# Patient Record
Sex: Female | Born: 1955
Health system: Southern US, Community
[De-identification: ages and names within clinical notes are randomized; demographics above are authoritative.]

## PROBLEM LIST (undated history)

## (undated) DIAGNOSIS — D509 Iron deficiency anemia, unspecified: Secondary | ICD-10-CM

## (undated) DIAGNOSIS — E049 Nontoxic goiter, unspecified: Secondary | ICD-10-CM

## (undated) DIAGNOSIS — M2550 Pain in unspecified joint: Secondary | ICD-10-CM

## (undated) DIAGNOSIS — I10 Essential (primary) hypertension: Secondary | ICD-10-CM

## (undated) DIAGNOSIS — Z780 Asymptomatic menopausal state: Secondary | ICD-10-CM

## (undated) DIAGNOSIS — E0590 Thyrotoxicosis, unspecified without thyrotoxic crisis or storm: Secondary | ICD-10-CM

## (undated) DIAGNOSIS — N938 Other specified abnormal uterine and vaginal bleeding: Secondary | ICD-10-CM

## (undated) DIAGNOSIS — D219 Benign neoplasm of connective and other soft tissue, unspecified: Secondary | ICD-10-CM

## (undated) HISTORY — PX: GUM SURGERY: SHX658

## (undated) HISTORY — DX: Asymptomatic menopausal state: Z78.0

## (undated) HISTORY — DX: Thyrotoxicosis, unspecified without thyrotoxic crisis or storm: E05.90

## (undated) HISTORY — DX: Nontoxic goiter, unspecified: E04.9

## (undated) HISTORY — PX: ECTOPIC PREGNANCY SURGERY: SHX613

## (undated) HISTORY — PX: COLONOSCOPY: SHX174

## (undated) HISTORY — PX: OTHER SURGICAL HISTORY: SHX169

## (undated) HISTORY — DX: Benign neoplasm of connective and other soft tissue, unspecified: D21.9

## (undated) HISTORY — DX: Pain in unspecified joint: M25.50

## (undated) HISTORY — PX: TUBAL LIGATION: SHX77

## (undated) HISTORY — DX: Other specified abnormal uterine and vaginal bleeding: N93.8

## (undated) HISTORY — DX: Iron deficiency anemia, unspecified: D50.9

---

## 1998-02-22 ENCOUNTER — Other Ambulatory Visit: Admission: RE | Admit: 1998-02-22 | Discharge: 1998-02-22 | Payer: Self-pay | Admitting: Obstetrics and Gynecology

## 1998-04-29 ENCOUNTER — Ambulatory Visit (HOSPITAL_COMMUNITY): Admission: RE | Admit: 1998-04-29 | Discharge: 1998-04-29 | Payer: Self-pay | Admitting: Obstetrics and Gynecology

## 1998-04-29 ENCOUNTER — Encounter: Payer: Self-pay | Admitting: Obstetrics and Gynecology

## 1999-05-07 ENCOUNTER — Inpatient Hospital Stay (HOSPITAL_COMMUNITY): Admission: AD | Admit: 1999-05-07 | Discharge: 1999-05-07 | Payer: Self-pay | Admitting: Obstetrics and Gynecology

## 2000-10-14 ENCOUNTER — Ambulatory Visit (HOSPITAL_COMMUNITY): Admission: RE | Admit: 2000-10-14 | Discharge: 2000-10-14 | Payer: Self-pay | Admitting: Internal Medicine

## 2000-10-14 ENCOUNTER — Encounter: Payer: Self-pay | Admitting: Internal Medicine

## 2001-05-11 ENCOUNTER — Encounter: Payer: Self-pay | Admitting: Obstetrics and Gynecology

## 2001-05-11 ENCOUNTER — Ambulatory Visit (HOSPITAL_COMMUNITY): Admission: RE | Admit: 2001-05-11 | Discharge: 2001-05-11 | Payer: Self-pay | Admitting: Obstetrics and Gynecology

## 2007-06-16 DIAGNOSIS — E049 Nontoxic goiter, unspecified: Secondary | ICD-10-CM

## 2007-06-16 HISTORY — DX: Nontoxic goiter, unspecified: E04.9

## 2007-11-15 ENCOUNTER — Ambulatory Visit (HOSPITAL_COMMUNITY): Admission: RE | Admit: 2007-11-15 | Discharge: 2007-11-15 | Payer: Self-pay | Admitting: Obstetrics and Gynecology

## 2008-02-08 ENCOUNTER — Encounter (HOSPITAL_COMMUNITY): Admission: RE | Admit: 2008-02-08 | Discharge: 2008-03-01 | Payer: Self-pay | Admitting: Internal Medicine

## 2008-02-24 ENCOUNTER — Encounter (HOSPITAL_COMMUNITY): Admission: RE | Admit: 2008-02-24 | Discharge: 2008-04-18 | Payer: Self-pay | Admitting: Internal Medicine

## 2008-06-28 ENCOUNTER — Encounter: Admission: RE | Admit: 2008-06-28 | Discharge: 2008-06-28 | Payer: Self-pay | Admitting: Internal Medicine

## 2008-07-11 ENCOUNTER — Other Ambulatory Visit: Admission: RE | Admit: 2008-07-11 | Discharge: 2008-07-11 | Payer: Self-pay | Admitting: Interventional Radiology

## 2008-07-11 ENCOUNTER — Encounter: Admission: RE | Admit: 2008-07-11 | Discharge: 2008-07-11 | Payer: Self-pay | Admitting: Internal Medicine

## 2008-07-11 ENCOUNTER — Encounter (INDEPENDENT_AMBULATORY_CARE_PROVIDER_SITE_OTHER): Payer: Self-pay | Admitting: Interventional Radiology

## 2010-07-07 ENCOUNTER — Encounter: Payer: Self-pay | Admitting: Internal Medicine

## 2010-08-18 ENCOUNTER — Other Ambulatory Visit (HOSPITAL_COMMUNITY): Payer: Self-pay | Admitting: Obstetrics and Gynecology

## 2010-08-18 DIAGNOSIS — Z1231 Encounter for screening mammogram for malignant neoplasm of breast: Secondary | ICD-10-CM

## 2010-08-25 ENCOUNTER — Ambulatory Visit (HOSPITAL_COMMUNITY)
Admission: RE | Admit: 2010-08-25 | Discharge: 2010-08-25 | Disposition: A | Discharge status: Home / Self Care | Payer: BC Managed Care – PPO | Source: Ambulatory Visit | Attending: Obstetrics and Gynecology | Admitting: Obstetrics and Gynecology

## 2010-08-25 DIAGNOSIS — Z1231 Encounter for screening mammogram for malignant neoplasm of breast: Secondary | ICD-10-CM

## 2010-09-03 DIAGNOSIS — Z833 Family history of diabetes mellitus: Secondary | ICD-10-CM | POA: Insufficient documentation

## 2010-09-03 DIAGNOSIS — Z8639 Personal history of other endocrine, nutritional and metabolic disease: Secondary | ICD-10-CM | POA: Insufficient documentation

## 2011-08-05 DIAGNOSIS — Z8639 Personal history of other endocrine, nutritional and metabolic disease: Secondary | ICD-10-CM

## 2011-08-05 DIAGNOSIS — Z833 Family history of diabetes mellitus: Secondary | ICD-10-CM

## 2011-09-14 ENCOUNTER — Other Ambulatory Visit: Payer: Self-pay | Admitting: Obstetrics and Gynecology

## 2011-09-14 DIAGNOSIS — Z1231 Encounter for screening mammogram for malignant neoplasm of breast: Secondary | ICD-10-CM

## 2011-09-16 ENCOUNTER — Ambulatory Visit (INDEPENDENT_AMBULATORY_CARE_PROVIDER_SITE_OTHER): Payer: BC Managed Care – PPO | Admitting: Obstetrics and Gynecology

## 2011-09-16 DIAGNOSIS — Z01419 Encounter for gynecological examination (general) (routine) without abnormal findings: Secondary | ICD-10-CM

## 2011-09-23 ENCOUNTER — Other Ambulatory Visit: Payer: BC Managed Care – PPO

## 2011-10-07 ENCOUNTER — Other Ambulatory Visit: Payer: BC Managed Care – PPO

## 2011-10-07 DIAGNOSIS — E785 Hyperlipidemia, unspecified: Secondary | ICD-10-CM

## 2011-10-07 DIAGNOSIS — E0590 Thyrotoxicosis, unspecified without thyrotoxic crisis or storm: Secondary | ICD-10-CM

## 2011-10-07 LAB — LIPID PANEL
Cholesterol: 197 mg/dL (ref 0–200)
LDL Cholesterol: 128 mg/dL — ABNORMAL HIGH (ref 0–99)
Triglycerides: 65 mg/dL (ref <150)

## 2011-10-08 ENCOUNTER — Telehealth: Payer: Self-pay

## 2011-10-08 ENCOUNTER — Ambulatory Visit (HOSPITAL_COMMUNITY): Payer: BC Managed Care – PPO | Attending: Obstetrics and Gynecology

## 2011-10-08 NOTE — Telephone Encounter (Signed)
Lm on vm to cb per test results.  

## 2011-10-09 ENCOUNTER — Telehealth: Payer: Self-pay

## 2011-10-09 NOTE — Telephone Encounter (Signed)
Lm on vm to cb per test results.  

## 2011-10-09 NOTE — Telephone Encounter (Signed)
No response from pt rgdg request for test results. Chart to file back.

## 2011-10-12 NOTE — Telephone Encounter (Signed)
Lm on vm to cb per test results.  

## 2011-10-13 NOTE — Telephone Encounter (Signed)
No response from pt after several attempts rgdg test results. Chart forwarded to have letter sent to pt to call office.

## 2011-10-14 ENCOUNTER — Encounter: Payer: Self-pay | Admitting: Obstetrics and Gynecology

## 2011-10-20 ENCOUNTER — Telehealth: Payer: Self-pay | Admitting: Obstetrics and Gynecology

## 2011-10-21 NOTE — Telephone Encounter (Signed)
Lm on vm for pt.to cb °

## 2011-10-21 NOTE — Telephone Encounter (Signed)
Tc from pt . Told pt TSH-wnl. LDL-elevated(128). Pt to fu with pcp rgdg labs. Pt states,"will establish pcp for fu". Results mailed to pt per pt's req. Pt voices understanding.

## 2011-10-21 NOTE — Telephone Encounter (Signed)
To chandra 

## 2012-06-02 ENCOUNTER — Ambulatory Visit (HOSPITAL_COMMUNITY)
Admission: RE | Admit: 2012-06-02 | Discharge: 2012-06-02 | Disposition: A | Discharge status: Home / Self Care | Payer: BC Managed Care – PPO | Source: Ambulatory Visit | Attending: Obstetrics and Gynecology | Admitting: Obstetrics and Gynecology

## 2012-06-02 DIAGNOSIS — Z1231 Encounter for screening mammogram for malignant neoplasm of breast: Secondary | ICD-10-CM | POA: Insufficient documentation

## 2013-03-24 ENCOUNTER — Other Ambulatory Visit: Payer: Self-pay | Admitting: Obstetrics and Gynecology

## 2013-03-24 DIAGNOSIS — Z1231 Encounter for screening mammogram for malignant neoplasm of breast: Secondary | ICD-10-CM

## 2013-06-05 ENCOUNTER — Ambulatory Visit (HOSPITAL_COMMUNITY): Payer: BC Managed Care – PPO

## 2013-06-22 ENCOUNTER — Ambulatory Visit (HOSPITAL_COMMUNITY): Payer: BC Managed Care – PPO

## 2013-06-28 ENCOUNTER — Ambulatory Visit (HOSPITAL_COMMUNITY)
Admission: RE | Admit: 2013-06-28 | Discharge: 2013-06-28 | Disposition: A | Discharge status: Home / Self Care | Payer: BC Managed Care – PPO | Source: Ambulatory Visit | Attending: Obstetrics and Gynecology | Admitting: Obstetrics and Gynecology

## 2013-06-28 DIAGNOSIS — Z1231 Encounter for screening mammogram for malignant neoplasm of breast: Secondary | ICD-10-CM | POA: Insufficient documentation

## 2015-08-16 ENCOUNTER — Other Ambulatory Visit: Payer: Self-pay | Admitting: Family Medicine

## 2015-08-16 DIAGNOSIS — M7989 Other specified soft tissue disorders: Secondary | ICD-10-CM

## 2015-08-22 ENCOUNTER — Ambulatory Visit
Admission: RE | Admit: 2015-08-22 | Discharge: 2015-08-22 | Disposition: A | Discharge status: Home / Self Care | Payer: Self-pay | Source: Ambulatory Visit | Attending: Family Medicine | Admitting: Family Medicine

## 2015-08-22 DIAGNOSIS — M7989 Other specified soft tissue disorders: Secondary | ICD-10-CM

## 2015-09-26 DIAGNOSIS — J069 Acute upper respiratory infection, unspecified: Secondary | ICD-10-CM | POA: Diagnosis not present

## 2015-09-26 DIAGNOSIS — R05 Cough: Secondary | ICD-10-CM | POA: Diagnosis not present

## 2015-09-26 DIAGNOSIS — M47819 Spondylosis without myelopathy or radiculopathy, site unspecified: Secondary | ICD-10-CM | POA: Diagnosis not present

## 2015-09-26 DIAGNOSIS — R0602 Shortness of breath: Secondary | ICD-10-CM | POA: Diagnosis not present

## 2016-04-13 DIAGNOSIS — M5406 Panniculitis affecting regions of neck and back, lumbar region: Secondary | ICD-10-CM | POA: Diagnosis not present

## 2016-04-13 DIAGNOSIS — M4317 Spondylolisthesis, lumbosacral region: Secondary | ICD-10-CM | POA: Diagnosis not present

## 2016-04-13 DIAGNOSIS — M5134 Other intervertebral disc degeneration, thoracic region: Secondary | ICD-10-CM | POA: Diagnosis not present

## 2016-04-13 DIAGNOSIS — M5137 Other intervertebral disc degeneration, lumbosacral region: Secondary | ICD-10-CM | POA: Diagnosis not present

## 2016-04-14 DIAGNOSIS — M5134 Other intervertebral disc degeneration, thoracic region: Secondary | ICD-10-CM | POA: Diagnosis not present

## 2016-04-14 DIAGNOSIS — M4317 Spondylolisthesis, lumbosacral region: Secondary | ICD-10-CM | POA: Diagnosis not present

## 2016-04-14 DIAGNOSIS — M5406 Panniculitis affecting regions of neck and back, lumbar region: Secondary | ICD-10-CM | POA: Diagnosis not present

## 2016-04-14 DIAGNOSIS — M5137 Other intervertebral disc degeneration, lumbosacral region: Secondary | ICD-10-CM | POA: Diagnosis not present

## 2016-04-15 DIAGNOSIS — M5134 Other intervertebral disc degeneration, thoracic region: Secondary | ICD-10-CM | POA: Diagnosis not present

## 2016-04-15 DIAGNOSIS — M4317 Spondylolisthesis, lumbosacral region: Secondary | ICD-10-CM | POA: Diagnosis not present

## 2016-04-15 DIAGNOSIS — M5137 Other intervertebral disc degeneration, lumbosacral region: Secondary | ICD-10-CM | POA: Diagnosis not present

## 2016-04-15 DIAGNOSIS — M5406 Panniculitis affecting regions of neck and back, lumbar region: Secondary | ICD-10-CM | POA: Diagnosis not present

## 2016-04-20 DIAGNOSIS — M5137 Other intervertebral disc degeneration, lumbosacral region: Secondary | ICD-10-CM | POA: Diagnosis not present

## 2016-04-20 DIAGNOSIS — M5134 Other intervertebral disc degeneration, thoracic region: Secondary | ICD-10-CM | POA: Diagnosis not present

## 2016-04-20 DIAGNOSIS — M5406 Panniculitis affecting regions of neck and back, lumbar region: Secondary | ICD-10-CM | POA: Diagnosis not present

## 2016-04-20 DIAGNOSIS — M4317 Spondylolisthesis, lumbosacral region: Secondary | ICD-10-CM | POA: Diagnosis not present

## 2016-04-21 DIAGNOSIS — M5137 Other intervertebral disc degeneration, lumbosacral region: Secondary | ICD-10-CM | POA: Diagnosis not present

## 2016-04-21 DIAGNOSIS — M4317 Spondylolisthesis, lumbosacral region: Secondary | ICD-10-CM | POA: Diagnosis not present

## 2016-04-21 DIAGNOSIS — M5406 Panniculitis affecting regions of neck and back, lumbar region: Secondary | ICD-10-CM | POA: Diagnosis not present

## 2016-04-21 DIAGNOSIS — M5134 Other intervertebral disc degeneration, thoracic region: Secondary | ICD-10-CM | POA: Diagnosis not present

## 2016-04-28 DIAGNOSIS — M5137 Other intervertebral disc degeneration, lumbosacral region: Secondary | ICD-10-CM | POA: Diagnosis not present

## 2016-04-28 DIAGNOSIS — M4317 Spondylolisthesis, lumbosacral region: Secondary | ICD-10-CM | POA: Diagnosis not present

## 2016-04-28 DIAGNOSIS — M5134 Other intervertebral disc degeneration, thoracic region: Secondary | ICD-10-CM | POA: Diagnosis not present

## 2016-04-28 DIAGNOSIS — M5406 Panniculitis affecting regions of neck and back, lumbar region: Secondary | ICD-10-CM | POA: Diagnosis not present

## 2016-05-04 DIAGNOSIS — M5406 Panniculitis affecting regions of neck and back, lumbar region: Secondary | ICD-10-CM | POA: Diagnosis not present

## 2016-05-04 DIAGNOSIS — M4317 Spondylolisthesis, lumbosacral region: Secondary | ICD-10-CM | POA: Diagnosis not present

## 2016-05-04 DIAGNOSIS — M5134 Other intervertebral disc degeneration, thoracic region: Secondary | ICD-10-CM | POA: Diagnosis not present

## 2016-05-04 DIAGNOSIS — M5137 Other intervertebral disc degeneration, lumbosacral region: Secondary | ICD-10-CM | POA: Diagnosis not present

## 2016-05-05 DIAGNOSIS — M4317 Spondylolisthesis, lumbosacral region: Secondary | ICD-10-CM | POA: Diagnosis not present

## 2016-05-05 DIAGNOSIS — M5134 Other intervertebral disc degeneration, thoracic region: Secondary | ICD-10-CM | POA: Diagnosis not present

## 2016-05-05 DIAGNOSIS — M5406 Panniculitis affecting regions of neck and back, lumbar region: Secondary | ICD-10-CM | POA: Diagnosis not present

## 2016-05-05 DIAGNOSIS — M5137 Other intervertebral disc degeneration, lumbosacral region: Secondary | ICD-10-CM | POA: Diagnosis not present

## 2016-05-11 DIAGNOSIS — M5406 Panniculitis affecting regions of neck and back, lumbar region: Secondary | ICD-10-CM | POA: Diagnosis not present

## 2016-05-11 DIAGNOSIS — M5134 Other intervertebral disc degeneration, thoracic region: Secondary | ICD-10-CM | POA: Diagnosis not present

## 2016-05-11 DIAGNOSIS — M5137 Other intervertebral disc degeneration, lumbosacral region: Secondary | ICD-10-CM | POA: Diagnosis not present

## 2016-05-11 DIAGNOSIS — M4317 Spondylolisthesis, lumbosacral region: Secondary | ICD-10-CM | POA: Diagnosis not present

## 2016-06-17 DIAGNOSIS — J209 Acute bronchitis, unspecified: Secondary | ICD-10-CM | POA: Diagnosis not present

## 2016-07-09 DIAGNOSIS — Z23 Encounter for immunization: Secondary | ICD-10-CM | POA: Diagnosis not present

## 2016-08-23 ENCOUNTER — Encounter (HOSPITAL_COMMUNITY): Payer: Self-pay | Admitting: Emergency Medicine

## 2016-08-23 ENCOUNTER — Emergency Department (HOSPITAL_COMMUNITY)
Admission: EM | Admit: 2016-08-23 | Discharge: 2016-08-23 | Disposition: A | Discharge status: Home / Self Care | Payer: BLUE CROSS/BLUE SHIELD | Attending: Emergency Medicine | Admitting: Emergency Medicine

## 2016-08-23 DIAGNOSIS — R040 Epistaxis: Secondary | ICD-10-CM | POA: Diagnosis not present

## 2016-08-23 DIAGNOSIS — E119 Type 2 diabetes mellitus without complications: Secondary | ICD-10-CM | POA: Diagnosis not present

## 2016-08-23 DIAGNOSIS — I10 Essential (primary) hypertension: Secondary | ICD-10-CM | POA: Diagnosis not present

## 2016-08-23 DIAGNOSIS — R030 Elevated blood-pressure reading, without diagnosis of hypertension: Secondary | ICD-10-CM | POA: Diagnosis not present

## 2016-08-23 MED ORDER — SILVER NITRATE-POT NITRATE 75-25 % EX MISC
1.0000 "application " | Freq: Once | CUTANEOUS | Status: AC
  Administered 2016-08-23: 1 via TOPICAL
  Filled 2016-08-23: qty 1

## 2016-08-23 MED ORDER — OXYMETAZOLINE HCL 0.05 % NA SOLN
1.0000 | Freq: Once | NASAL | Status: AC
  Administered 2016-08-23: 1 via NASAL
  Filled 2016-08-23 (×2): qty 15

## 2016-08-23 NOTE — ED Triage Notes (Signed)
Pt had nose bleed onset 1 and was able to stop the bleeding but started again around 3 and has not been able to stop. Pt went to UC and was sent for further evaluation.

## 2016-08-23 NOTE — Discharge Instructions (Addendum)
Please read and follow all provided instructions.  Your diagnoses today include:  1. Epistaxis   2. Hypertension, unspecified type     Tests performed today include: Vital signs. See below for your results today.   Medications prescribed:  Take as prescribed   Home care instructions:  Follow any educational materials contained in this packet.  Follow-up instructions: Please follow-up with your primary care provider for further evaluation of symptoms and treatment   Return instructions:  Please return to the Emergency Department if you do not get better, if you get worse, or new symptoms OR  - Fever (temperature greater than 101.71F)  - Bleeding that does not stop with holding pressure to the area    -Severe pain (please note that you may be more sore the day after your accident)  - Chest Pain  - Difficulty breathing  - Severe nausea or vomiting  - Inability to tolerate food and liquids  - Passing out  - Skin becoming red around your wounds  - Change in mental status (confusion or lethargy)  - New numbness or weakness    Please return if you have any other emergent concerns.  Additional Information:  Your vital signs today were: BP 171/99 (BP Location: Left Arm)    Pulse 114    Resp 18    SpO2 97%  If your blood pressure (BP) was elevated above 135/85 this visit, please have this repeated by your doctor within one month. ---------------

## 2016-08-23 NOTE — ED Provider Notes (Signed)
[facility] DEPT Provider Note   CSN: [id number] Arrival date & time: 08/23/16  1642     History   Chief Complaint No chief complaint on file.   HPI Stephanie Roth is a 61 y.o. female.  HPI  61 y.o. female with a hx of DM, presents to the Emergency Department today complaining of epistaxis with onset yesterday. Notes intermittent up until today. Bleeding from left nare. No N/V. No headache. No syncope. No lightheadedness. No CP/SOB. No trauma. Sent from UC due to epistaxis and HTN. No other symptoms noted.   Past Medical History:  Diagnosis Date  . Diabetes mellitus   . DUB (dysfunctional uterine bleeding)   . Fibroids    uterine  . Goiter 2009  . Hyperthyroidism   . Iron deficiency anemia   . Joint pain   . Menopause     Patient Active Problem List   Diagnosis Date Noted  . History of hyperthyroidism 09/03/2010  . Family history of diabetes mellitus 09/03/2010    Past Surgical History:  Procedure Laterality Date  . CESAREAN SECTION    . ECTOPIC PREGNANCY SURGERY    . GUM SURGERY    . TUBAL LIGATION    . TUBAL LIGATION      OB History    No data available       Home Medications    Prior to Admission medications   Not on File    Family History Family History  Problem Relation Age of Onset  . Hypertension Mother     Social History Social History  Substance Use Topics  . Smoking status: Not on file  . Smokeless tobacco: Not on file  . Alcohol use Not on file     Allergies   Patient has no known allergies.   Review of Systems Review of Systems ROS reviewed and all are negative for acute change except as noted in the HPI.  Physical Exam Updated Vital Signs BP 171/99 (BP Location: Left Arm)   Pulse 114   Resp 18   SpO2 97%   Physical Exam  Constitutional: She is oriented to person, place, and time. Vital signs are normal. She appears well-developed and well-nourished.  HENT:  Head: Normocephalic and atraumatic.  Right Ear:  Hearing normal.  Left Ear: Hearing normal.  Nose: Epistaxis (left nare) is observed.  Anterior epistaxis noted  Eyes: Conjunctivae and EOM are normal. Pupils are equal, round, and reactive to light.  Neck: Normal range of motion. Neck supple.  Cardiovascular: Normal rate, regular rhythm, normal heart sounds and intact distal pulses.   Pulmonary/Chest: Effort normal and breath sounds normal. No respiratory distress. She has no wheezes.  Abdominal: Soft. There is no tenderness.  Musculoskeletal: Normal range of motion.  Neurological: She is alert and oriented to person, place, and time.  Skin: Skin is warm and dry.  Psychiatric: She has a normal mood and affect. Her speech is normal and behavior is normal. Thought content normal.  Nursing note and vitals reviewed.  ED Treatments / Results  Labs (all labs ordered are listed, but only abnormal results are displayed) Labs Reviewed - No data to display  EKG  EKG Interpretation None      Radiology No results found.  Procedures .Epistaxis Management Date/Time: 08/23/2016 5:44 PM Performed by: Shary Decamp Authorized by: Shary Decamp   Consent:    Consent obtained:  Verbal   Consent given by:  Patient Procedure details:    Treatment site:  L anterior  Treatment method:  Silver nitrate   Treatment complexity:  Limited   Treatment episode: initial   Post-procedure details:    Assessment:  Bleeding stopped   Patient tolerance of procedure:  Tolerated well, no immediate complications   (including critical care time)  Medications Ordered in ED Medications  silver nitrate applicators applicator 1 application (not administered)  oxymetazoline (AFRIN) 0.05 % nasal spray 1 spray (1 spray Each Nare Given 08/23/16 1724)   Initial Impression / Assessment and Plan / ED Course  I have reviewed the triage vital signs and the nursing notes.  Pertinent labs & imaging results that were available during my care of the patient were reviewed  by me and considered in my medical decision making (see chart for details).  Final Clinical Impressions(s) / ED Diagnoses     {I have reviewed the relevant previous healthcare records.  {I obtained HPI from historian. {Patient discussed with supervising physician.  ED Course:  Assessment: Pt is a 61 y.o. female who presents with epistaxis from left nare. Sent from UC due to uncontrolled bleed and HTN. On exam, pt in NAD. Nontoxic/nonseptic appearing. VSS. Afebrile. Lungs CTA. Heart RRR. Anterior epistaxis noted form left nare. Bleeding controlled with Afrin, direct pressure and silver nitrate. Observed in ED without acute bleed. Plan is to DC home with close follow up to PCP for HTN. At time of discharge, Patient is in no acute distress. Vital Signs are stable. Patient is able to ambulate. Patient able to tolerate PO.   Disposition/Plan:  DC Home Additional Verbal discharge instructions given and discussed with patient.  Pt Instructed to f/u with PCP in the next week for evaluation and treatment of symptoms. Return precautions given Pt acknowledges and agrees with plan  Supervising Physician Sharlett Iles, MD  Final diagnoses:  Epistaxis  Hypertension, unspecified type    New Prescriptions New Prescriptions   No medications on file     Shary Decamp, PA-C 08/23/16 [place], MD 08/25/16 [phone]

## 2016-08-27 DIAGNOSIS — R040 Epistaxis: Secondary | ICD-10-CM | POA: Diagnosis not present

## 2016-08-27 DIAGNOSIS — I10 Essential (primary) hypertension: Secondary | ICD-10-CM | POA: Diagnosis not present

## 2016-10-01 DIAGNOSIS — D649 Anemia, unspecified: Secondary | ICD-10-CM | POA: Diagnosis not present

## 2016-10-01 DIAGNOSIS — I10 Essential (primary) hypertension: Secondary | ICD-10-CM | POA: Diagnosis not present

## 2017-06-04 DIAGNOSIS — I10 Essential (primary) hypertension: Secondary | ICD-10-CM | POA: Diagnosis not present

## 2017-06-04 DIAGNOSIS — Z23 Encounter for immunization: Secondary | ICD-10-CM | POA: Diagnosis not present

## 2017-06-04 DIAGNOSIS — L720 Epidermal cyst: Secondary | ICD-10-CM | POA: Diagnosis not present

## 2017-09-22 DIAGNOSIS — I872 Venous insufficiency (chronic) (peripheral): Secondary | ICD-10-CM | POA: Diagnosis not present

## 2017-09-22 DIAGNOSIS — L720 Epidermal cyst: Secondary | ICD-10-CM | POA: Diagnosis not present

## 2018-01-04 DIAGNOSIS — L509 Urticaria, unspecified: Secondary | ICD-10-CM | POA: Diagnosis not present

## 2018-01-12 DIAGNOSIS — Z6829 Body mass index (BMI) 29.0-29.9, adult: Secondary | ICD-10-CM | POA: Diagnosis not present

## 2018-01-12 DIAGNOSIS — S39012A Strain of muscle, fascia and tendon of lower back, initial encounter: Secondary | ICD-10-CM | POA: Diagnosis not present

## 2018-01-12 DIAGNOSIS — L509 Urticaria, unspecified: Secondary | ICD-10-CM | POA: Diagnosis not present

## 2018-01-18 DIAGNOSIS — T783XXA Angioneurotic edema, initial encounter: Secondary | ICD-10-CM | POA: Diagnosis not present

## 2018-01-18 DIAGNOSIS — Z6829 Body mass index (BMI) 29.0-29.9, adult: Secondary | ICD-10-CM | POA: Diagnosis not present

## 2018-01-27 DIAGNOSIS — R002 Palpitations: Secondary | ICD-10-CM | POA: Diagnosis not present

## 2018-01-27 DIAGNOSIS — Z8639 Personal history of other endocrine, nutritional and metabolic disease: Secondary | ICD-10-CM | POA: Diagnosis not present

## 2018-02-12 DIAGNOSIS — M792 Neuralgia and neuritis, unspecified: Secondary | ICD-10-CM | POA: Diagnosis not present

## 2018-02-12 DIAGNOSIS — Z6830 Body mass index (BMI) 30.0-30.9, adult: Secondary | ICD-10-CM | POA: Diagnosis not present

## 2018-02-12 DIAGNOSIS — M25511 Pain in right shoulder: Secondary | ICD-10-CM | POA: Diagnosis not present

## 2018-02-12 DIAGNOSIS — M79601 Pain in right arm: Secondary | ICD-10-CM | POA: Diagnosis not present

## 2018-02-12 DIAGNOSIS — M9971 Connective tissue and disc stenosis of intervertebral foramina of cervical region: Secondary | ICD-10-CM | POA: Diagnosis not present

## 2018-02-12 DIAGNOSIS — M5030 Other cervical disc degeneration, unspecified cervical region: Secondary | ICD-10-CM | POA: Diagnosis not present

## 2018-02-12 DIAGNOSIS — M50322 Other cervical disc degeneration at C5-C6 level: Secondary | ICD-10-CM | POA: Diagnosis not present

## 2018-02-12 DIAGNOSIS — M19011 Primary osteoarthritis, right shoulder: Secondary | ICD-10-CM | POA: Diagnosis not present

## 2018-03-03 ENCOUNTER — Ambulatory Visit: Payer: BLUE CROSS/BLUE SHIELD | Admitting: Allergy

## 2018-03-03 ENCOUNTER — Encounter: Payer: Self-pay | Admitting: Allergy

## 2018-03-03 VITALS — BP 102/72 | HR 105 | Resp 16 | Ht 58.5 in | Wt 152.4 lb

## 2018-03-03 DIAGNOSIS — H1010 Acute atopic conjunctivitis, unspecified eye: Secondary | ICD-10-CM

## 2018-03-03 DIAGNOSIS — L508 Other urticaria: Secondary | ICD-10-CM | POA: Diagnosis not present

## 2018-03-03 DIAGNOSIS — T783XXD Angioneurotic edema, subsequent encounter: Secondary | ICD-10-CM

## 2018-03-03 NOTE — Patient Instructions (Addendum)
Hives and swelling   - at this time etiology of hives and swelling is unknown.  Hives can be caused by a variety of different triggers including illness/infection, foods, medications, stings, exercise, pressure, vibrations, extremes of temperature to name a few however majority of the time there is no identifiable trigger.  You have history of hyperthyroidism which does place you at increase risk of having other autoimmune disorders like autoimmune hives/swelling.   Your symptoms have been ongoing for >6 weeks making this chronic thus will obtain labwork to evaluate: CBC w diff, CMP, tryptase, hive panel, alpha-gal panel   - environmental allergy skin testing today is positive to grass pollens   - select food allergy skin testing is negative   - for management of hives and swelling use of long-acting antihistamine like Xyzal 5mg  or Allegra 1 tablet twice a day and recommend adding Pepcid 20mg  1 tablet twice a day   Conjunctivitis   - itchy/watery eyes primarily in spring and fall   - allergen avoidance measures to allergens as above   - can continue long-acting antihistamine as needed like your Claritin or Zyrtec   - for itchy/watery eyes can use OTC allergy relief eye drop Alaway or Zaditor 1 drop each eye up to twice a day as needed.   Follow-up 3 months or sooner if needed

## 2018-03-03 NOTE — Progress Notes (Signed)
New Patient Note  RE: Stephanie Roth MRN: [id number] DOB: 1956/04/21 Date of Office Visit: 03/03/2018  Referring provider: Antony Contras, MD Primary care provider: Antony Contras, MD  Chief Complaint: swelling and itching  History of present illness: Stephanie Roth is a 62 y.o. female presenting today for consultation for angioedema and urticaria.    She states she has a history of swelling episodes that she believes is related to MSG.  She states if she eats a large quantity of food with MSG she develops lip swelling.  She has been avoiding MSG.    She states more recently she has been having swelling and itching episodes but has not been eating any MSG.  Her initial reaction was in early August after eating a [employer] fried crispy chicken salad that had cucumbers that were very bitter.  She stopped eating the cucumbers as she was concerned they may have contained MSG with her bitter they were.  The following morning she had develops hives on her back and arms.  She states the itching continued to get worse and reports the rash was spreading.  She went to her PCP clinic and was given prednisone course and benadryl which did help.    The following week she got another [employer] fried crispy chicken salad and did not eat the cucumbers this time.  The hives returned again the following day.  She went to [facility] and was prescribed another prednisone course.  Also received hydroxyzine but states this medication makes her very groggy.   A week later she ate soup her sister made with chicken broth that had seasoned salt.  She woke up in the night and noticed itching and lip felt tingly and was swollen.  She states she called on-call doctor who advised she go to UC.  She received another round of prednisone.  Took several days for swelling to resolve.  Her PCP advised her to take zyrtec instead of claritin.  She started zyrtec 1 tab in PM and 1/2 tab in AM.  The zyrtec also makes her a bit drowsy.   She denies  to her knowledge these meals containing any red meat products.  She states she does eat red meat on occasion.   The hives when they occur come and go and last for several days before they all go away.  They are not leaving any marks or bruising wants to resolve.  She denies any joint aches or pains with the hives and swelling.  She denies any fevers.  She denies any preceding illnesses prior to August.  She has not had any new medications, new foods, change in soaps lotions or detergents.  She does states about 2 weeks prior to onset of these episodes she was stung by a bee on her foot and had local pain and swelling.  She also states she has had several ant bites with local pain and itch.    She has history of hyperthyroidism and states it is under control.  She states she has had an episode of heart racing and she felt lightheaded and went again to UC and states her BP was elevated.  She also states she was itching but does not recall having any rash with these symptoms as well.  She is not sure what triggered these symptoms.  This occurred around the 3rd week of August.   She does report watery and itchy eyes primarily.  Spring and fall are more problematic seasons for her and  usually will take claritin during these seasons which does help.    She denies a history of asthma or eczema.  She denies any previous food allergy history other than the MSG.  Review of systems: Review of Systems  Constitutional: Negative for chills, fever and malaise/fatigue.  HENT: Negative for congestion, ear discharge, nosebleeds and sore throat.   Eyes: Negative for pain, discharge and redness.  Respiratory: Negative for cough, shortness of breath and wheezing.   Cardiovascular: Negative for chest pain.  Gastrointestinal: Negative for abdominal pain, constipation, diarrhea, heartburn, nausea and vomiting.  Musculoskeletal: Negative for joint pain.  Skin: Positive for itching and rash.  Neurological: Negative for  headaches.    All other systems negative unless noted above in HPI  Past medical history: Past Medical History:  Diagnosis Date  . Diabetes mellitus   . DUB (dysfunctional uterine bleeding)   . Fibroids    uterine  . Goiter 2009  . Hyperthyroidism   . Iron deficiency anemia   . Joint pain   . Menopause     Past surgical history: Past Surgical History:  Procedure Laterality Date  . CESAREAN SECTION    . ECTOPIC PREGNANCY SURGERY    . GUM SURGERY    . TUBAL LIGATION      Family history:  Family History  Problem Relation Age of Onset  . Hypertension Mother   . COPD Mother   . Congestive Heart Failure Mother   . Arthritis Mother   . Hypertension Father   . Aortic aneurysm Father   . Psoriasis Sister   . Eczema Sister   . Arthritis Sister     Social history: She lives in a home with carpeting with gas heating and central cooling.  There is a dog in the home.  There is no concern for water damage, mildew or roaches in the home.  She is an Research scientist (life sciences).  She denies a smoking history.  Medication List: Allergies as of 03/03/2018      Reactions   Monosodium Glutamate       Medication List        Accurate as of 03/03/18  1:41 PM. Always use your most recent med list.          amLODipine 2.5 MG tablet Commonly known as:  NORVASC Take 2.5 mg by mouth daily.   cetirizine 10 MG tablet Commonly known as:  ZYRTEC Take 10 mg by mouth daily as needed for allergies.   COD LIVER OIL PO Take 1 capsule by mouth daily.   FLAXSEED OIL PO Take 1,300 mg by mouth 2 (two) times daily.   HONEY PO Take 1 each by mouth as needed.   loratadine 10 MG tablet Commonly known as:  CLARITIN Take 10 mg by mouth daily.   Vitamin D3 2000 units Tabs Take 1 tablet by mouth daily.   WOMENS MULTIVITAMIN PO Take 1 tablet by mouth daily.       Known medication allergies: Allergies  Allergen Reactions  . Monosodium Glutamate      Physical examination: Blood pressure  102/72, pulse (!) 105, resp. rate 16, height 4' 10.5" (1.486 m), weight 152 lb 6.4 oz (69.1 kg), SpO2 96 %.  General: Alert, interactive, in no acute distress. HEENT: PERRLA, TMs pearly gray, turbinates minimally edematous without discharge, post-pharynx non erythematous. Neck: Supple without lymphadenopathy. Lungs: Clear to auscultation without wheezing, rhonchi or rales. {no increased work of breathing. CV: Normal S1, S2 without murmurs. Abdomen: Nondistended, nontender. Skin: Warm  and dry, without lesions or rashes. Extremities:  No clubbing, cyanosis or edema. Neuro:   Grossly intact.  Diagnositics/Labs:  Allergy testing: Environmental allergy skin prick testing is positive to grass pollens.  Intradermal testing is negative.  Select food allergy skin prick testing is negative.  Allergy testing results were read and interpreted by provider, documented by clinical staff.   Assessment and plan: Urticaria with angioedema   - at this time etiology of hives and swelling is unknown.  Hives can be caused by a variety of different triggers including illness/infection, foods, medications, stings, exercise, pressure, vibrations, extremes of temperature to name a few however majority of the time there is no identifiable trigger.  You have history of hyperthyroidism which does place you at increase risk of having other autoimmune disorders like autoimmune hives/swelling.   Your symptoms have been ongoing for >6 weeks making this chronic thus will obtain labwork to evaluate: CBC w diff, CMP, tryptase, hive panel, alpha-gal panel   - environmental allergy skin testing today is positive to grass pollens   - select food allergy skin testing is negative   - for management of hives and swelling use of long-acting antihistamine like Xyzal 5mg  or Allegra 1 tablet twice a day and recommend adding Pepcid 20mg  1 tablet twice a day.  We are changing from Zyrtec as this makes her drowsy and hopefully will be able  to find an antihistamine that does not make her drowsy.   Conjunctivitis, allergic   - She has sensitivity to grass pollens   - allergen avoidance measures to allergens as above   - can continue long-acting antihistamine as needed like your Claritin or Zyrtec   - for itchy/watery eyes can use OTC allergy relief eye drop Alaway or Zaditor 1 drop each eye up to twice a day as needed.   Follow-up 3 months or sooner if needed  I appreciate the opportunity to take part in Fredna's care. Please do not hesitate to contact me with questions.  Sincerely,   Prudy Feeler, MD Allergy/Immunology [facility]

## 2018-03-09 LAB — ALPHA-GAL PANEL
Alpha Gal IgE*: <0.1 kU/L (ref <0.10)
BEEF CLASS INTERPRETATION: 0
Beef (Bos spp) IgE: <0.1 kU/L (ref <0.35)
Class Interpretation: 0
LAMB CLASS INTERPRETATION: 0
Lamb/Mutton (Ovis spp) IgE: <0.1 kU/L (ref <0.35)

## 2018-03-09 LAB — CBC WITH DIFFERENTIAL
Basophils Absolute: 0.1 10*3/uL (ref 0.0–0.2)
Basos: 1 %
EOS (ABSOLUTE): 0.1 10*3/uL (ref 0.0–0.4)
Eos: 1 %
Hematocrit: 41.9 % (ref 34.0–46.6)
Hemoglobin: 13.6 g/dL (ref 11.1–15.9)
IMMATURE GRANULOCYTES: 0 %
Immature Grans (Abs): 0 10*3/uL (ref 0.0–0.1)
Lymphocytes Absolute: 1.7 10*3/uL (ref 0.7–3.1)
Lymphs: 24 %
MCH: 29.7 pg (ref 26.6–33.0)
MCHC: 32.5 g/dL (ref 31.5–35.7)
MCV: 92 fL (ref 79–97)
MONOS ABS: 0.6 10*3/uL (ref 0.1–0.9)
Monocytes: 8 %
NEUTROS PCT: 66 %
Neutrophils Absolute: 4.7 10*3/uL (ref 1.4–7.0)
RBC: 4.58 x10E6/uL (ref 3.77–5.28)
RDW: 13.9 % (ref 12.3–15.4)
WBC: 7.1 10*3/uL (ref 3.4–10.8)

## 2018-03-09 LAB — COMPREHENSIVE METABOLIC PANEL
ALK PHOS: 100 IU/L (ref 39–117)
ALT: 22 IU/L (ref 0–32)
AST: 21 IU/L (ref 0–40)
Albumin/Globulin Ratio: 1.4 (ref 1.2–2.2)
Albumin: 4.4 g/dL (ref 3.6–4.8)
BILIRUBIN TOTAL: 0.2 mg/dL (ref 0.0–1.2)
BUN / CREAT RATIO: 23 (ref 12–28)
BUN: 21 mg/dL (ref 8–27)
CHLORIDE: 104 mmol/L (ref 96–106)
CO2: 23 mmol/L (ref 20–29)
Calcium: 10.2 mg/dL (ref 8.7–10.3)
Creatinine, Ser: 0.9 mg/dL (ref 0.57–1.00)
GFR calc Af Amer: 80 mL/min/{1.73_m2} (ref >59)
GFR calc non Af Amer: 69 mL/min/{1.73_m2} (ref >59)
GLOBULIN, TOTAL: 3.2 g/dL (ref 1.5–4.5)
Glucose: 86 mg/dL (ref 65–99)
POTASSIUM: 4.7 mmol/L (ref 3.5–5.2)
SODIUM: 143 mmol/L (ref 134–144)
Total Protein: 7.6 g/dL (ref 6.0–8.5)

## 2018-03-09 LAB — CHRONIC URTICARIA: cu index: <1.9 (ref <10)

## 2018-03-09 LAB — TRYPTASE: Tryptase: 7.3 ug/L (ref 2.2–13.2)

## 2018-03-14 DIAGNOSIS — E7800 Pure hypercholesterolemia, unspecified: Secondary | ICD-10-CM | POA: Diagnosis not present

## 2018-03-14 DIAGNOSIS — I10 Essential (primary) hypertension: Secondary | ICD-10-CM | POA: Diagnosis not present

## 2018-03-14 DIAGNOSIS — Z1211 Encounter for screening for malignant neoplasm of colon: Secondary | ICD-10-CM | POA: Diagnosis not present

## 2018-03-14 DIAGNOSIS — L508 Other urticaria: Secondary | ICD-10-CM | POA: Diagnosis not present

## 2018-03-14 DIAGNOSIS — Z23 Encounter for immunization: Secondary | ICD-10-CM | POA: Diagnosis not present

## 2018-03-14 DIAGNOSIS — Z0000 Encounter for general adult medical examination without abnormal findings: Secondary | ICD-10-CM | POA: Diagnosis not present

## 2018-06-02 ENCOUNTER — Ambulatory Visit: Payer: BLUE CROSS/BLUE SHIELD | Admitting: Allergy

## 2018-06-02 ENCOUNTER — Encounter: Payer: Self-pay | Admitting: Allergy

## 2018-06-02 VITALS — BP 130/70 | HR 64 | Resp 16

## 2018-06-02 DIAGNOSIS — H1010 Acute atopic conjunctivitis, unspecified eye: Secondary | ICD-10-CM

## 2018-06-02 DIAGNOSIS — T783XXD Angioneurotic edema, subsequent encounter: Secondary | ICD-10-CM | POA: Diagnosis not present

## 2018-06-02 DIAGNOSIS — L508 Other urticaria: Secondary | ICD-10-CM | POA: Diagnosis not present

## 2018-06-02 NOTE — Patient Instructions (Addendum)
Hives and swelling   -  Hives can be caused by a variety of different triggers including illness/infection, foods, medications, stings, exercise, pressure, vibrations, extremes of temperature to name a few however majority of the time there is no identifiable trigger.  Labwork done for hive work-up was unremarkable and reassuring.     - continue avoidance for grass pollens   - select food allergy skin testing was negative at initial visit   - for management of hives and swelling use of long-acting antihistamine Allegra180mg   1 tablet once a day.  If hives return on Allegra daily dosing then increase to 1 tablet twice a day   Conjunctivitis, allergic   - itchy/watery eyes primarily in spring and fall   - allergen avoidance measures to allergens as above   - can continue long-acting antihistamine Allegra as above   - for itchy/watery eyes can use OTC allergy relief eye drop Alaway or Zaditor 1 drop each eye up to twice a day as needed.   Follow-up 4-6 months or sooner if needed

## 2018-06-02 NOTE — Progress Notes (Signed)
Follow-up Note  RE: Stephanie Roth MRN: [id number] DOB: 07-May-1956 Date of Office Visit: 06/02/2018   History of present illness: Stephanie Roth is a 62 y.o. female presenting today for follow-up of urticaria and allergic conjunctivitis.  She was seen on March 03, 2018 for her initial visit with myself.  Did perform a urticaria a work-up after this visit that was unremarkable.  She states she is taking Allegra 1 tablet at bedtime.  She states she did try taking the Xyzal but it also made her drowsy.  Allegra does not cause any adverse effects.  She has not had any further episodes of urticaria since her last visit and being on daily Allegra. She does have a grass sensitivity however she has not had any significant ocular symptoms since her last visit.  I recommended that she use an over-the-counter allergy relief eyedrops during the summer if she does develop allergic conjunctivitis symptoms during grass pollen exposure.  Review of systems: Review of Systems  Constitutional: Negative for chills, fever and malaise/fatigue.  HENT: Negative for congestion, ear discharge, ear pain, nosebleeds and sore throat.   Eyes: Negative for pain, discharge and redness.  Respiratory: Negative for cough, shortness of breath and wheezing.   Cardiovascular: Negative for chest pain.  Gastrointestinal: Negative for abdominal pain, constipation, diarrhea, heartburn, nausea and vomiting.  Musculoskeletal: Negative for joint pain.  Skin: Negative for itching and rash.  Neurological: Negative for headaches.    All other systems negative unless noted above in HPI  Past medical/social/surgical/family history have been reviewed and are unchanged unless specifically indicated below.  No changes  Medication List: Allergies as of 06/02/2018      Reactions   Monosodium Glutamate       Medication List       Accurate as of June 02, 2018  1:15 PM. Always use your most recent med list.          amLODipine 2.5 MG tablet Commonly known as:  NORVASC Take 2.5 mg by mouth daily.   cetirizine 10 MG tablet Commonly known as:  ZYRTEC Take 10 mg by mouth daily as needed for allergies.   COD LIVER OIL PO Take 1 capsule by mouth daily.   fexofenadine 180 MG tablet Commonly known as:  ALLEGRA Take 180 mg by mouth daily.   FLAXSEED OIL PO Take 1,300 mg by mouth 2 (two) times daily.   HONEY PO Take 1 each by mouth as needed.   loratadine 10 MG tablet Commonly known as:  CLARITIN Take 10 mg by mouth daily.   Vitamin D3 50 MCG (2000 UT) Tabs Take 1 tablet by mouth daily.   WOMENS MULTIVITAMIN PO Take 1 tablet by mouth daily.       Known medication allergies: Allergies  Allergen Reactions  . Monosodium Glutamate      Physical examination: Blood pressure 130/70, pulse 64, resp. rate 16.  General: Alert, interactive, in no acute distress. HEENT: PERRLA, TMs pearly gray, turbinates non-edematous without discharge, post-pharynx non erythematous. Neck: Supple without lymphadenopathy. Lungs: Clear to auscultation without wheezing, rhonchi or rales. {no increased work of breathing. CV: Normal S1, S2 without murmurs. Abdomen: Nondistended, nontender. Skin: Warm and dry, without lesions or rashes. Extremities:  No clubbing, cyanosis or edema. Neuro:   Grossly intact.  Diagnositics/Labs: Labs:  Component     Latest Ref Rng & Units 03/03/2018  WBC     3.4 - 10.8 x10E3/uL 7.1  RBC     3.77 -  5.28 x10E6/uL 4.58  Hemoglobin     11.1 - 15.9 g/dL 13.6  HCT     34.0 - 46.6 % 41.9  MCV     79 - 97 fL 92  MCH     26.6 - 33.0 pg 29.7  MCHC     31.5 - 35.7 g/dL 32.5  RDW     12.3 - 15.4 % 13.9  Neutrophils     Not Estab. % 66  Lymphs     Not Estab. % 24  Monocytes     Not Estab. % 8  Eos     Not Estab. % 1  Basos     Not Estab. % 1  NEUT#     1.4 - 7.0 x10E3/uL 4.7  Lymphocyte #     0.7 - 3.1 x10E3/uL 1.7  Monocytes Absolute     0.1 - 0.9 x10E3/uL 0.6   EOS (ABSOLUTE)     0.0 - 0.4 x10E3/uL 0.1  Basophils Absolute     0.0 - 0.2 x10E3/uL 0.1  Immature Granulocytes     Not Estab. % 0  Immature Grans (Abs)     0.0 - 0.1 x10E3/uL 0.0  Glucose     65 - 99 mg/dL 86  BUN     8 - 27 mg/dL 21  Creatinine     0.57 - 1.00 mg/dL 0.90  GFR, Est Non African American     >59 mL/min/1.73 69  GFR, Est African American     >59 mL/min/1.73 80  BUN/Creatinine Ratio     12 - 28 23  Sodium     134 - 144 mmol/L 143  Potassium     3.5 - 5.2 mmol/L 4.7  Chloride     96 - 106 mmol/L 104  CO2     20 - 29 mmol/L 23  Calcium     8.7 - 10.3 mg/dL 10.2  Total Protein     6.0 - 8.5 g/dL 7.6  Albumin     3.6 - 4.8 g/dL 4.4  Globulin, Total     1.5 - 4.5 g/dL 3.2  Albumin/Globulin Ratio     1.2 - 2.2 1.4  Total Bilirubin     0.0 - 1.2 mg/dL 0.2  Alkaline Phosphatase     39 - 117 IU/L 100  AST     0 - 40 IU/L 21  ALT     0 - 32 IU/L 22  Beef (Bos spp) IgE     <0.35 kU/L <0.10  Class Interpretation      0  Lamb/Mutton (Ovis spp) IgE     <0.35 kU/L <0.10  Class Interpretation      0  Pork (Sus spp) IgE     <0.35 kU/L <0.10  Class Interpretation      0  Alpha Gal IgE*     <0.10 kU/L <0.10  cu index     <10 <1.9  Tryptase     2.2 - 13.2 ug/L 7.3    Assessment and plan: Chronic idiopathic urticaria with angioedema   -  Hives can be caused by a variety of different triggers including illness/infection, foods, medications, stings, exercise, pressure, vibrations, extremes of temperature to name a few however majority of the time there is no identifiable trigger.  Labwork done for hive work-up was unremarkable and reassuring.     - continue avoidance for grass pollens   - select food allergy skin testing was negative at initial visit   - for  management of hives and swelling use of long-acting antihistamine Allegra180mg   1 tablet once a day.  If hives return on Allegra daily dosing then increase to 1 tablet twice a day    Conjunctivitis, allergic   - itchy/watery eyes primarily in spring and fall   - allergen avoidance measures to allergens as above   - can continue long-acting antihistamine Allegra as above   - for itchy/watery eyes can use OTC allergy relief eye drop Alaway or Zaditor 1 drop each eye up to twice a day as needed.   Follow-up 4-6 months or sooner if needed   I appreciate the opportunity to take part in Stephanie Roth's care. Please do not hesitate to contact me with questions.  Sincerely,   Prudy Feeler, MD Allergy/Immunology [facility]

## 2019-05-08 DIAGNOSIS — Z1211 Encounter for screening for malignant neoplasm of colon: Secondary | ICD-10-CM | POA: Diagnosis not present

## 2019-05-26 DIAGNOSIS — M4802 Spinal stenosis, cervical region: Secondary | ICD-10-CM | POA: Diagnosis not present

## 2019-05-26 DIAGNOSIS — M62838 Other muscle spasm: Secondary | ICD-10-CM | POA: Diagnosis not present

## 2019-05-26 DIAGNOSIS — M5030 Other cervical disc degeneration, unspecified cervical region: Secondary | ICD-10-CM | POA: Diagnosis not present

## 2019-05-26 DIAGNOSIS — M4722 Other spondylosis with radiculopathy, cervical region: Secondary | ICD-10-CM | POA: Diagnosis not present

## 2019-06-12 DIAGNOSIS — I10 Essential (primary) hypertension: Secondary | ICD-10-CM | POA: Diagnosis not present

## 2019-06-12 DIAGNOSIS — Z79899 Other long term (current) drug therapy: Secondary | ICD-10-CM | POA: Diagnosis not present

## 2019-06-12 DIAGNOSIS — R040 Epistaxis: Secondary | ICD-10-CM | POA: Diagnosis not present

## 2019-06-12 DIAGNOSIS — E039 Hypothyroidism, unspecified: Secondary | ICD-10-CM | POA: Diagnosis not present

## 2019-06-12 DIAGNOSIS — R000 Tachycardia, unspecified: Secondary | ICD-10-CM | POA: Diagnosis not present

## 2019-06-12 DIAGNOSIS — R58 Hemorrhage, not elsewhere classified: Secondary | ICD-10-CM | POA: Diagnosis not present

## 2019-06-12 DIAGNOSIS — Z7952 Long term (current) use of systemic steroids: Secondary | ICD-10-CM | POA: Diagnosis not present

## 2019-06-13 DIAGNOSIS — R040 Epistaxis: Secondary | ICD-10-CM | POA: Diagnosis not present

## 2019-07-18 DIAGNOSIS — E7800 Pure hypercholesterolemia, unspecified: Secondary | ICD-10-CM | POA: Diagnosis not present

## 2019-07-18 DIAGNOSIS — J385 Laryngeal spasm: Secondary | ICD-10-CM | POA: Diagnosis not present

## 2019-07-18 DIAGNOSIS — I10 Essential (primary) hypertension: Secondary | ICD-10-CM | POA: Diagnosis not present

## 2019-07-18 DIAGNOSIS — Z87898 Personal history of other specified conditions: Secondary | ICD-10-CM | POA: Diagnosis not present

## 2019-08-02 ENCOUNTER — Ambulatory Visit: Payer: BC Managed Care – PPO | Admitting: Neurology

## 2019-08-02 ENCOUNTER — Encounter: Payer: Self-pay | Admitting: Neurology

## 2019-08-02 ENCOUNTER — Other Ambulatory Visit: Payer: Self-pay

## 2019-08-02 VITALS — BP 124/84 | HR 79 | Temp 97.0°F | Ht 60.0 in | Wt 159.2 lb

## 2019-08-02 DIAGNOSIS — R351 Nocturia: Secondary | ICD-10-CM | POA: Diagnosis not present

## 2019-08-02 DIAGNOSIS — E669 Obesity, unspecified: Secondary | ICD-10-CM | POA: Diagnosis not present

## 2019-08-02 DIAGNOSIS — R0681 Apnea, not elsewhere classified: Secondary | ICD-10-CM

## 2019-08-02 DIAGNOSIS — R0683 Snoring: Secondary | ICD-10-CM | POA: Diagnosis not present

## 2019-08-02 NOTE — Patient Instructions (Signed)

## 2019-08-02 NOTE — Progress Notes (Signed)
Subjective:    Patient ID: Stephanie Roth is a 64 y.o. female.  HPI      Star Age, MD, PhD [facility] [address]O. [place], [place] [address]  Dear Mar Daring,   I saw your patient, Stephanie Roth, upon your kind request in my sleep clinic today for initial consultation of her sleep disorder, in particular, concern for underlying obstructive sleep apnea.  The patient is unaccompanied today.  As you know, [name] is a 64 year old right-handed woman with an underlying medical history of hypothyroidism, joint pain, iron deficiency anemia, shoulder pain, and borderline obesity, who was noted to have witnessed apneic pauses during a recent colonoscopy.  She does report snoring.  I reviewed your office note from 06/29/2019.  Her Epworth sleepiness score is 3 out of 24, fatigue severity score is 31 out of 63.  She currently stays in [place].  She had been taking care of her mother in [place], mom passed away in 2019-04-08.  Patient works from home, she is an Research scientist (life sciences).  She is divorced, she has 2 sons who live in [place].  She denies recurrent morning headaches but does have nocturia about twice per average night.  Because she took care of her mother, she did have an erratic sleep schedule.  She still has some trouble maintaining a set schedule.  She has no family history of OSA.  She drinks caffeine and limitation, 1 cup of coffee per day, sometimes 1-1/2 cups.  She does not drink any daily soda or tea.  She is a non-smoker and does not drink any alcohol currently.  Her Past Medical History Is Significant For: Past Medical History:  Diagnosis Date  . Diabetes mellitus   . DUB (dysfunctional uterine bleeding)   . Fibroids    uterine  . Goiter 2009  . Hyperthyroidism   . Iron deficiency anemia   . Joint pain   . Menopause     Her Past Surgical History Is Significant For: Past Surgical History:  Procedure Laterality Date  . CESAREAN  SECTION    . ECTOPIC PREGNANCY SURGERY    . GUM SURGERY    . TUBAL LIGATION      Her Family History Is Significant For: Family History  Problem Relation Age of Onset  . Hypertension Mother   . COPD Mother   . Congestive Heart Failure Mother   . Arthritis Mother   . Hypertension Father   . Aortic aneurysm Father   . Psoriasis Sister   . Eczema Sister   . Arthritis Sister     Her Social History Is Significant For: Social History   Socioeconomic History  . Marital status: Divorced    Spouse name: Not on file  . Number of children: Not on file  . Years of education: Not on file  . Highest education level: Not on file  Occupational History  . Not on file  Tobacco Use  . Smoking status: Never Smoker  . Smokeless tobacco: Never Used  Substance and Sexual Activity  . Alcohol use: Never  . Drug use: Never  . Sexual activity: Not on file  Other Topics Concern  . Not on file  Social History Narrative  . Not on file   Social Determinants of Health   Financial Resource Strain:   . Difficulty of Paying Living Expenses: Not on file  Food Insecurity:   . Worried About Charity fundraiser in the Last Year: Not on  file  . [place] in the Last Year: Not on file  Transportation Needs:   . Lack of Transportation (Medical): Not on file  . Lack of Transportation (Non-Medical): Not on file  Physical Activity:   . Days of Exercise per Week: Not on file  . Minutes of Exercise per Session: Not on file  Stress:   . Feeling of Stress : Not on file  Social Connections:   . Frequency of Communication with Friends and Family: Not on file  . Frequency of Social Gatherings with Friends and Family: Not on file  . Attends Religious Services: Not on file  . Active Member of Clubs or Organizations: Not on file  . Attends Archivist Meetings: Not on file  . Marital Status: Not on file    Her Allergies Are:  Allergies  Allergen Reactions  . Monosodium Glutamate   :    Her Current Medications Are:  Outpatient Encounter Medications as of 08/02/2019  Medication Sig  . amLODipine (NORVASC) 2.5 MG tablet Take 2.5 mg by mouth daily.  . Cholecalciferol (VITAMIN D3) 2000 units TABS Take 1 tablet by mouth daily.  . COD LIVER OIL PO Take 1 capsule by mouth daily.  . fexofenadine (ALLEGRA) 180 MG tablet Take 180 mg by mouth daily.  . Flaxseed, Linseed, (FLAXSEED OIL PO) Take 1,300 mg by mouth 2 (two) times daily.  . HONEY PO Take 1 each by mouth as needed.  . Multiple Vitamins-Minerals (WOMENS MULTIVITAMIN PO) Take 1 tablet by mouth daily.  . [DISCONTINUED] cetirizine (ZYRTEC) 10 MG tablet Take 10 mg by mouth daily as needed for allergies.  . [DISCONTINUED] loratadine (CLARITIN) 10 MG tablet Take 10 mg by mouth daily.   No facility-administered encounter medications on file as of 08/02/2019.  :  Review of Systems:  Out of a complete 14 point review of systems, all are reviewed and negative with the exception of these symptoms as listed below: Review of Systems  Neurological:       Here for sleep consult. No prior sleep study.  Pt does report snoring.   Epworth Sleepiness Scale 0= would never doze 1= slight chance of dozing 2= moderate chance of dozing 3= high chance of dozing  Sitting and reading:0 Watching TV:0 Sitting inactive in a public place (ex. Theater or meeting):0 As a passenger in a car for an hour without a break:1 Lying down to rest in the afternoon:2 Sitting and talking to someone:0 Sitting quietly after lunch (no alcohol):0 In a car, while stopped in traffic:0 Total:3     Objective:  Neurological Exam  Physical Exam Physical Examination:   Vitals:   08/02/19 1313  BP: 124/84  Pulse: 79  Temp: (!) 97 F (36.1 C)   General Examination: The patient is a very pleasant 64 y.o. female in no acute distress. She appears well-developed and well-nourished and well groomed.   HEENT: Normocephalic, atraumatic, pupils are equal,  round and reactive to light and accommodation. Extraocular tracking is good without limitation to gaze excursion or nystagmus noted. Normal smooth pursuit is noted. Hearing is grossly intact. Face is symmetric with normal facial animation and normal facial sensation. Speech is clear with no dysarthria noted. There is no hypophonia. There is no lip, neck/head, jaw or voice tremor. Neck is supple with full range of passive and active motion. There are no carotid bruits on auscultation. Oropharynx exam reveals: mild mouth dryness, adequate dental hygiene and moderate airway crowding, due to Smaller airway  entry, tonsils not fully visualized, uvula not fully visualized, redundant soft palate noted, Mallampati class III, neck circumference is 13-7/8 inches.  She has a mild overbite.  Tongue protrudes centrally and palate elevates symmetrically.  Chest: Clear to auscultation without wheezing, rhonchi or crackles noted.  Heart: S1+S2+0, regular and normal without murmurs, rubs or gallops noted.   Abdomen: Soft, non-tender and non-distended with normal bowel sounds appreciated on auscultation.  Extremities: There is no pitting edema in the distal lower extremities bilaterally.   Skin: Warm and dry without trophic changes noted.  Musculoskeletal: exam reveals no obvious joint deformities, tenderness or joint swelling or erythema.   Neurologically:  Mental status: The patient is awake, alert and oriented in all 4 spheres. Her immediate and remote memory, attention, language skills and fund of knowledge are appropriate. There is no evidence of aphasia, agnosia, apraxia or anomia. Speech is clear with normal prosody and enunciation. Thought process is linear. Mood is normal and affect is normal.  Cranial nerves II - XII are as described above under HEENT exam. In addition: shoulder shrug is normal with equal shoulder height noted. Motor exam: Normal bulk, strength and tone is noted. There is no drift, tremor or  rebound. Romberg is negative. Fine motor skills and coordination: grossly intact.  Cerebellar testing: No dysmetria or intention tremor. There is no truncal or gait ataxia.  Sensory exam: intact to light touch.  Gait, station and balance: She stands easily. No veering to one side is noted. No leaning to one side is noted. Posture is age-appropriate and stance is narrow based. Gait shows normal stride length and normal pace. No problems turning are noted. Tandem walk is unremarkable.   Assessment and Plan:  In summary, Deidra L Sligh is a very pleasant 64 y.o.-year old female with an underlying medical history of hypothyroidism, joint pain, iron deficiency anemia, shoulder pain, and borderline obesity, whose history and physical exam are concerning for obstructive sleep apnea (OSA). I had a long chat with the patient about my findings and the diagnosis of OSA, its prognosis and treatment options. We talked about medical treatments, surgical interventions and non-pharmacological approaches. I explained in particular the risks and ramifications of untreated moderate to severe OSA, especially with respect to developing cardiovascular disease down the Road, including congestive heart failure, difficult to treat hypertension, cardiac arrhythmias, or stroke. Even type 2 diabetes has, in part, been linked to untreated OSA. Symptoms of untreated OSA include daytime sleepiness, memory problems, mood irritability and mood disorder such as depression and anxiety, lack of energy, as well as recurrent headaches, especially morning headaches. We talked about trying to maintain a healthy lifestyle in general, as well as the importance of weight control. We also talked about the importance of good sleep hygiene. I recommended the following at this time: sleep study.  I explained the sleep test procedure to the patient and also outlined possible surgical and non-surgical treatment options of OSA, including the use of a  custom-made dental device (which would require a referral to a specialist dentist or oral surgeon). I also explained the CPAP treatment option to the patient, who indicated that she would be willing to try CPAP if the need arises. I explained the importance of being compliant with PAP treatment, not only for insurance purposes but primarily to improve Her symptoms, and for the patient's long term health benefit, including to reduce Her cardiovascular risks. I answered all her questions today and the patient was in agreement. I plan to  see her back after the sleep study is completed and encouraged her to call with any interim questions, concerns, problems or updates.   Thank you very much for allowing me to participate in the care of this nice patient. If I can be of any further assistance to you please do not hesitate to call me at [phone].  Sincerely,   Star Age, MD, PhD

## 2019-08-14 DIAGNOSIS — T8859XA Other complications of anesthesia, initial encounter: Secondary | ICD-10-CM | POA: Diagnosis not present

## 2019-08-14 DIAGNOSIS — G478 Other sleep disorders: Secondary | ICD-10-CM | POA: Diagnosis not present

## 2019-08-14 DIAGNOSIS — I10 Essential (primary) hypertension: Secondary | ICD-10-CM | POA: Diagnosis not present

## 2019-08-14 DIAGNOSIS — R0683 Snoring: Secondary | ICD-10-CM | POA: Diagnosis not present

## 2019-09-19 ENCOUNTER — Ambulatory Visit: Payer: BLUE CROSS/BLUE SHIELD

## 2019-09-20 DIAGNOSIS — G4733 Obstructive sleep apnea (adult) (pediatric): Secondary | ICD-10-CM | POA: Diagnosis not present

## 2019-10-16 ENCOUNTER — Other Ambulatory Visit: Payer: Self-pay | Admitting: Family Medicine

## 2019-10-16 DIAGNOSIS — Z1231 Encounter for screening mammogram for malignant neoplasm of breast: Secondary | ICD-10-CM

## 2019-10-20 DIAGNOSIS — E7800 Pure hypercholesterolemia, unspecified: Secondary | ICD-10-CM | POA: Diagnosis not present

## 2019-10-20 DIAGNOSIS — G4733 Obstructive sleep apnea (adult) (pediatric): Secondary | ICD-10-CM | POA: Diagnosis not present

## 2019-10-20 DIAGNOSIS — I10 Essential (primary) hypertension: Secondary | ICD-10-CM | POA: Diagnosis not present

## 2019-10-20 DIAGNOSIS — L508 Other urticaria: Secondary | ICD-10-CM | POA: Diagnosis not present

## 2019-10-30 ENCOUNTER — Ambulatory Visit: Payer: Self-pay

## 2019-11-16 ENCOUNTER — Other Ambulatory Visit: Payer: Self-pay

## 2019-11-16 ENCOUNTER — Ambulatory Visit
Admission: RE | Admit: 2019-11-16 | Discharge: 2019-11-16 | Disposition: A | Discharge status: Home / Self Care | Payer: BC Managed Care – PPO | Source: Ambulatory Visit | Attending: Family Medicine | Admitting: Family Medicine

## 2019-11-16 DIAGNOSIS — Z1231 Encounter for screening mammogram for malignant neoplasm of breast: Secondary | ICD-10-CM | POA: Diagnosis not present

## 2020-02-23 DIAGNOSIS — Z23 Encounter for immunization: Secondary | ICD-10-CM | POA: Diagnosis not present

## 2020-02-23 DIAGNOSIS — L989 Disorder of the skin and subcutaneous tissue, unspecified: Secondary | ICD-10-CM | POA: Diagnosis not present

## 2020-02-29 DIAGNOSIS — L720 Epidermal cyst: Secondary | ICD-10-CM | POA: Diagnosis not present

## 2020-02-29 DIAGNOSIS — L723 Sebaceous cyst: Secondary | ICD-10-CM | POA: Diagnosis not present

## 2020-02-29 DIAGNOSIS — D485 Neoplasm of uncertain behavior of skin: Secondary | ICD-10-CM | POA: Diagnosis not present

## 2020-03-18 DIAGNOSIS — G4733 Obstructive sleep apnea (adult) (pediatric): Secondary | ICD-10-CM | POA: Diagnosis not present

## 2020-05-17 DIAGNOSIS — E7800 Pure hypercholesterolemia, unspecified: Secondary | ICD-10-CM | POA: Diagnosis not present

## 2020-05-17 DIAGNOSIS — Z0000 Encounter for general adult medical examination without abnormal findings: Secondary | ICD-10-CM | POA: Diagnosis not present

## 2020-05-21 DIAGNOSIS — H18593 Other hereditary corneal dystrophies, bilateral: Secondary | ICD-10-CM | POA: Diagnosis not present

## 2020-05-21 DIAGNOSIS — H353132 Nonexudative age-related macular degeneration, bilateral, intermediate dry stage: Secondary | ICD-10-CM | POA: Diagnosis not present

## 2020-12-18 ENCOUNTER — Other Ambulatory Visit: Payer: Self-pay | Admitting: Family Medicine

## 2020-12-18 DIAGNOSIS — Z1231 Encounter for screening mammogram for malignant neoplasm of breast: Secondary | ICD-10-CM

## 2020-12-26 ENCOUNTER — Other Ambulatory Visit: Payer: Self-pay

## 2020-12-26 ENCOUNTER — Ambulatory Visit
Admission: RE | Admit: 2020-12-26 | Discharge: 2020-12-26 | Disposition: A | Discharge status: Home / Self Care | Payer: 59 | Source: Ambulatory Visit | Attending: Family Medicine | Admitting: Family Medicine

## 2020-12-26 DIAGNOSIS — Z1231 Encounter for screening mammogram for malignant neoplasm of breast: Secondary | ICD-10-CM

## 2021-04-07 ENCOUNTER — Ambulatory Visit (INDEPENDENT_AMBULATORY_CARE_PROVIDER_SITE_OTHER): Payer: 59 | Admitting: Plastic Surgery

## 2021-04-07 ENCOUNTER — Encounter: Payer: Self-pay | Admitting: Plastic Surgery

## 2021-04-07 ENCOUNTER — Other Ambulatory Visit: Payer: Self-pay

## 2021-04-07 VITALS — BP 152/88 | HR 74 | Ht 60.0 in | Wt 148.6 lb

## 2021-04-07 DIAGNOSIS — D489 Neoplasm of uncertain behavior, unspecified: Secondary | ICD-10-CM | POA: Diagnosis not present

## 2021-04-07 DIAGNOSIS — Z411 Encounter for cosmetic surgery: Secondary | ICD-10-CM | POA: Diagnosis not present

## 2021-04-07 NOTE — Progress Notes (Signed)
Referring Provider Antony Contras, MD [place],  [place] [id number]   CC:  Cystic lesion left temporal area  Stephanie Roth is an 65 y.o. female.  HPI: The patient is a 65 year old female with a cystic lesion on the left temporal area of the face.  This been present for about 3 years but has increased in size.  Itching of the left temporal region with ultrasound that suggested it could be a lipoma or sebaceous cyst.  She was seen by dermatology and they thought it was most likely sebaceous cyst.  She is also interested in possible cosmetic surgery of her lower eyelids.  She does not like the bags under her eyes or her tear trough which she thinks is prominent.  Allergies  Allergen Reactions   Monosodium Glutamate     Outpatient Encounter Medications as of 04/07/2021  Medication Sig   amLODipine (NORVASC) 2.5 MG tablet TK 1 T PO QD   ascorbic acid (VITAMIN C) 500 MG tablet Vitamin C 500 mg tablet  Take by oral route.   atropine 1 % ophthalmic solution atropine 1 % eye drops   Cholecalciferol (VITAMIN D3) 2000 units TABS Take 1 tablet by mouth daily.   COD LIVER OIL PO Take 1 capsule by mouth daily.   fexofenadine (ALLEGRA) 180 MG tablet Take 180 mg by mouth daily.   Flaxseed, Linseed, (FLAXSEED OIL PO) Take 1,300 mg by mouth 2 (two) times daily.   HONEY PO Take 1 each by mouth as needed.   Multiple Vitamins-Minerals (ICAPS AREDS FORMULA PO) Take by mouth.   Multiple Vitamins-Minerals (WOMENS MULTIVITAMIN PO) Take 1 tablet by mouth daily.   [DISCONTINUED] amLODipine (NORVASC) 2.5 MG tablet Take 2.5 mg by mouth daily.   No facility-administered encounter medications on file as of 04/07/2021.     Past Medical History:  Diagnosis Date   Diabetes mellitus    DUB (dysfunctional uterine bleeding)    Fibroids    uterine   Goiter 2009   Hyperthyroidism    Iron deficiency anemia    Joint pain    Menopause     Past Surgical History:  Procedure Laterality  Date   CESAREAN SECTION     ECTOPIC PREGNANCY SURGERY     GUM SURGERY     TUBAL LIGATION      Family History  Problem Relation Age of Onset   Hypertension Mother    COPD Mother    Congestive Heart Failure Mother    Arthritis Mother    Hypertension Father    Aortic aneurysm Father    Psoriasis Sister    Eczema Sister    Arthritis Sister     Social history: No tobacco use, no illicit drug use.   Review of Systems General: Denies fevers, chills, weight loss CV: Denies chest pain, shortness of breath, palpitations   Physical Exam Vitals with BMI 04/07/2021 08/02/2019 06/02/2018  Height 5\' 0"  5\' 0"  -  Weight 148 lbs 10 oz 159 lbs 4 oz -  BMI 78.29 56.2 -  Systolic 130 865 784  Diastolic 88 84 70  Pulse 74 79 64    General:  No acute distress,  Alert and oriented, Non-Toxic, Normal speech and affect HEENT: Left temporal lesion 2.5 x 1.5 cm, appears to have a punctum visible.  Adjacent to the left temporal hairline.  She does have some prominence of her tear trough and some bags under her eyes that are moderately prominent.   Assessment/Plan  1.  Regarding the left temporal mass this is most likely a sebaceous cyst.  Options were discussed and I think she would be a good candidate for excision under local in the office.  I would try to conceal the scar as well as possible in the hairline but she does have a chance of having a visible scar.  There is a small chance that she could have damage to the facial nerve in this area and left brow weakness but I think most likely will be posterior to the nerve.  We discussed these risks and benefits of surgery.  I think she would benefit from excision.  2.  The patient is a candidate for a cosmetic lower blepharoplasty.  I think that she would get some benefit of softening her lid cheek junction, reducing fullness of her lower lid, and raiding the lid cheek junction.  Since she is dark skinned I explained that there is a chance that she could  have visible scars adjacent to her lateral canthus from lower blepharoplasty.  She is interested in considering this procedure.   Time based coding: 18 minutes were spent with the patient.  Greater than 50% was spent on counseling cordination of care.    Scott Vanderveer 04/07/2021, 1:19 PM

## 2021-04-25 ENCOUNTER — Ambulatory Visit: Payer: 59 | Admitting: Plastic Surgery

## 2021-07-11 ENCOUNTER — Telehealth: Payer: Self-pay

## 2021-07-11 NOTE — Telephone Encounter (Signed)
At this time, patient is interested in just removing cyst of her left temple and not blepharoplasty surgery. She is now on Medicare. Will discuss with Dr. Erin Hearing if he excise the cyst in the office or OP. Will call patient back and advise.

## 2021-07-17 IMAGING — MG DIGITAL SCREENING BILAT W/ CAD
5 series · 5 of 5 positions shown · non-contrast
Comparison: Previous exam(s).

ACR Breast Density Category a: The breast tissue is almost entirely
fatty.

CLINICAL DATA: Screening.

EXAM:
DIGITAL SCREENING BILATERAL MAMMOGRAM WITH CAD
TECHNIQUE: Bilateral screening digital craniocaudal and mediolateral oblique
mammograms were obtained. The images were evaluated with
computer-aided detection.

[L CC]
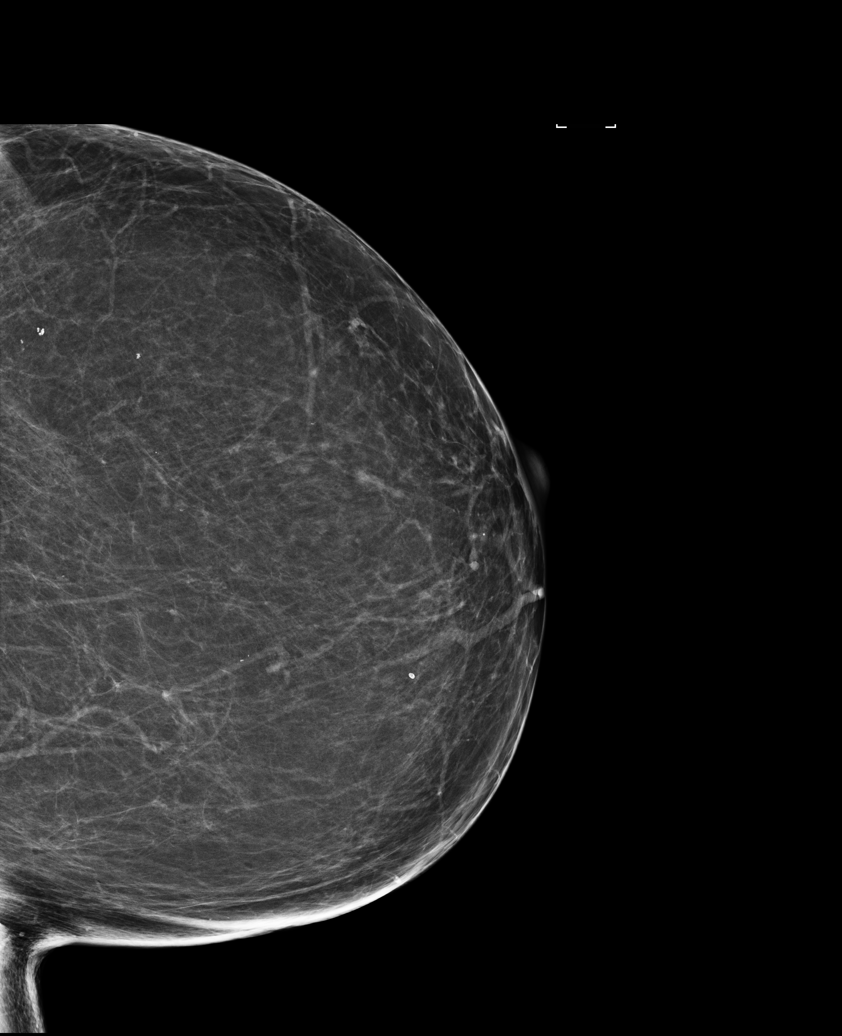

[R CC]
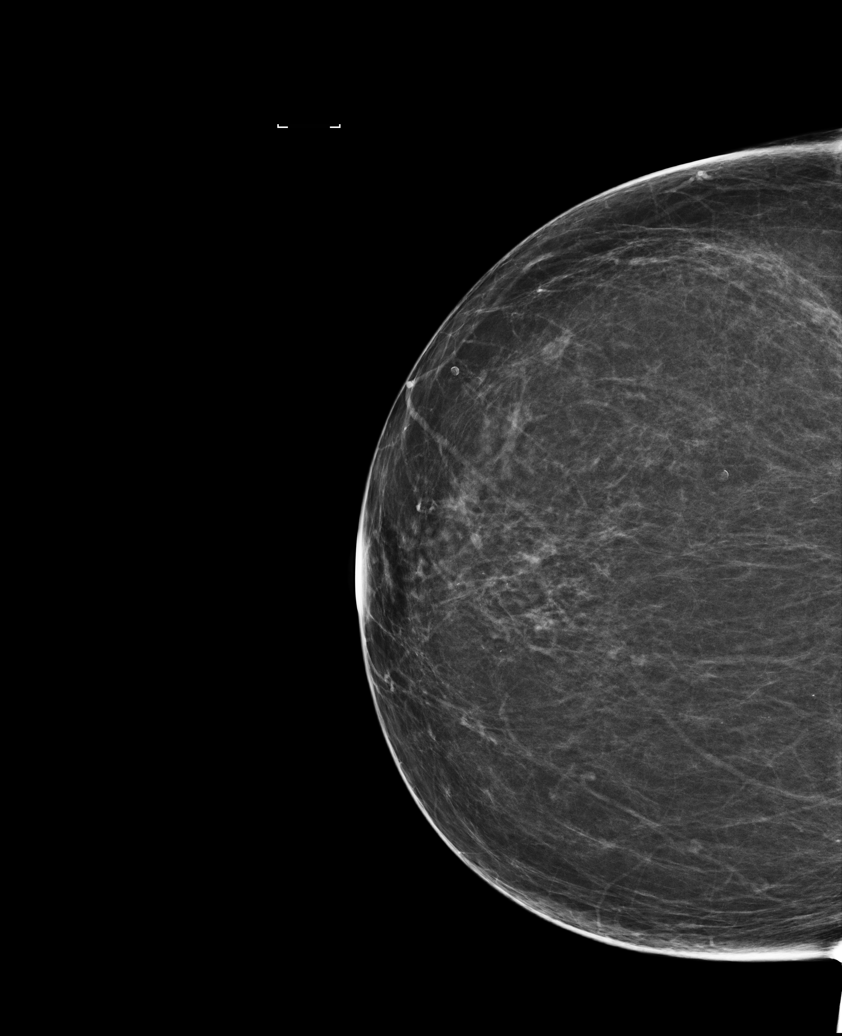

[R MLO (1 of 2)]
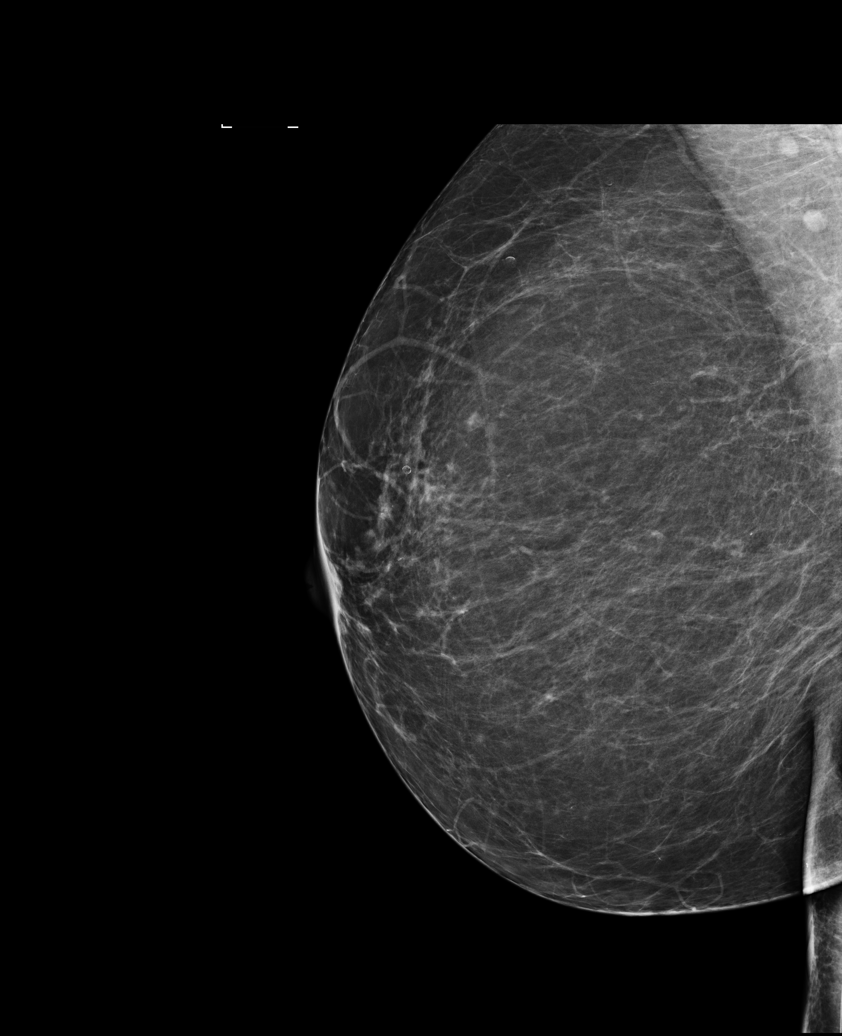

[L MLO]
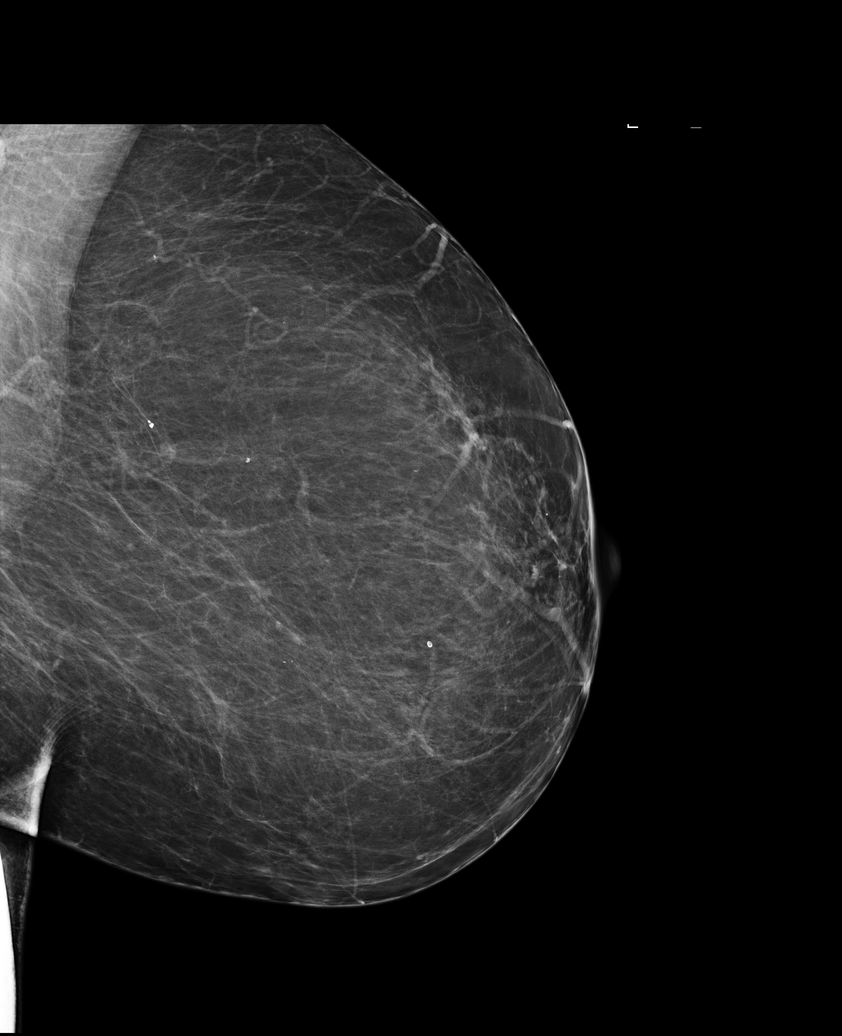

[R MLO (2 of 2)]
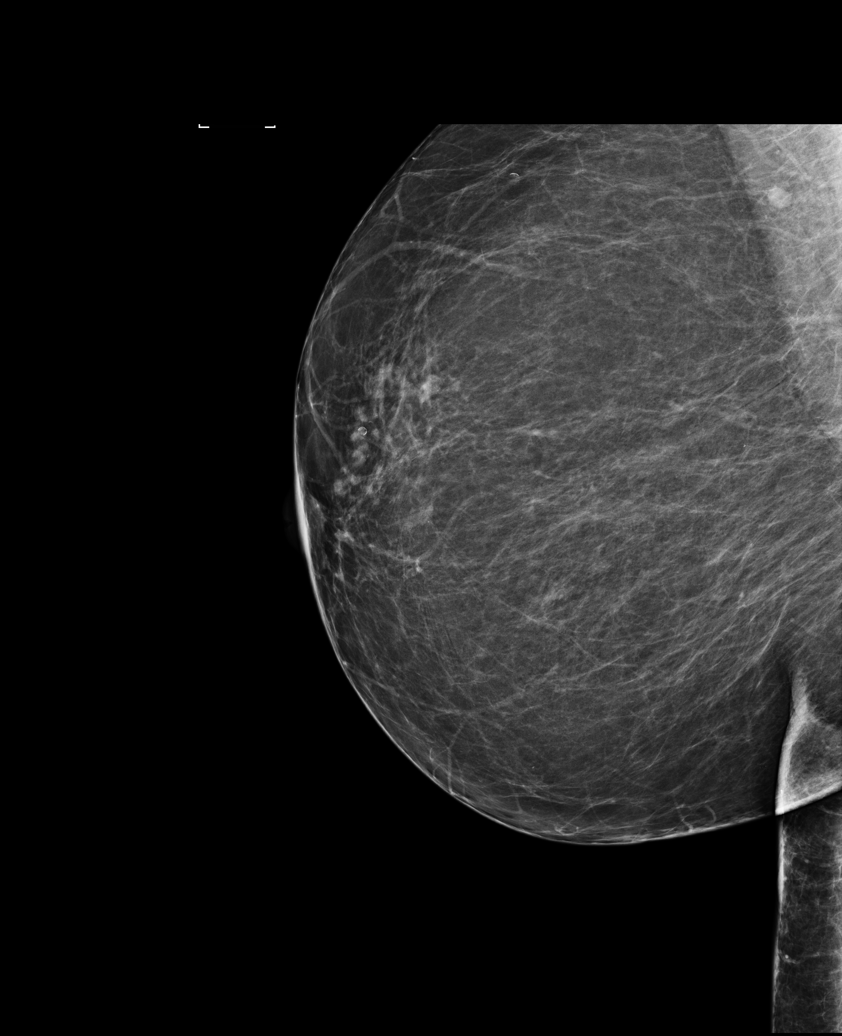

[5 of 5 positions shown; findings below may reference images not displayed]

FINDINGS: There are no findings suspicious for malignancy.
IMPRESSION: No mammographic evidence of malignancy. A result letter of this
screening mammogram will be mailed directly to the patient.

RECOMMENDATION:
Screening mammogram in one year. (Code:9G-A-2OA)

BI-RADS CATEGORY  1: Negative.

## 2021-09-10 DIAGNOSIS — H02413 Mechanical ptosis of bilateral eyelids: Secondary | ICD-10-CM | POA: Diagnosis not present

## 2021-09-10 DIAGNOSIS — H04123 Dry eye syndrome of bilateral lacrimal glands: Secondary | ICD-10-CM | POA: Diagnosis not present

## 2021-10-07 DIAGNOSIS — E0590 Thyrotoxicosis, unspecified without thyrotoxic crisis or storm: Secondary | ICD-10-CM | POA: Diagnosis not present

## 2021-10-07 DIAGNOSIS — H0520 Unspecified exophthalmos: Secondary | ICD-10-CM | POA: Diagnosis not present

## 2021-10-17 ENCOUNTER — Other Ambulatory Visit: Payer: Self-pay | Admitting: Endocrinology

## 2021-10-17 DIAGNOSIS — E0590 Thyrotoxicosis, unspecified without thyrotoxic crisis or storm: Secondary | ICD-10-CM

## 2021-10-22 ENCOUNTER — Ambulatory Visit
Admission: RE | Admit: 2021-10-22 | Discharge: 2021-10-22 | Disposition: A | Discharge status: Home / Self Care | Payer: Medicare Other | Source: Ambulatory Visit | Attending: Endocrinology | Admitting: Endocrinology

## 2021-10-22 DIAGNOSIS — E0590 Thyrotoxicosis, unspecified without thyrotoxic crisis or storm: Secondary | ICD-10-CM | POA: Diagnosis not present

## 2021-10-22 DIAGNOSIS — E041 Nontoxic single thyroid nodule: Secondary | ICD-10-CM | POA: Diagnosis not present

## 2021-11-03 DIAGNOSIS — H02423 Myogenic ptosis of bilateral eyelids: Secondary | ICD-10-CM | POA: Diagnosis not present

## 2021-11-21 DIAGNOSIS — E7800 Pure hypercholesterolemia, unspecified: Secondary | ICD-10-CM | POA: Diagnosis not present

## 2021-11-21 DIAGNOSIS — I10 Essential (primary) hypertension: Secondary | ICD-10-CM | POA: Diagnosis not present

## 2021-11-21 DIAGNOSIS — L508 Other urticaria: Secondary | ICD-10-CM | POA: Diagnosis not present

## 2021-11-21 DIAGNOSIS — G4733 Obstructive sleep apnea (adult) (pediatric): Secondary | ICD-10-CM | POA: Diagnosis not present

## 2021-12-01 ENCOUNTER — Other Ambulatory Visit: Payer: Self-pay | Admitting: Family Medicine

## 2021-12-01 DIAGNOSIS — E2839 Other primary ovarian failure: Secondary | ICD-10-CM

## 2021-12-18 ENCOUNTER — Other Ambulatory Visit: Payer: Self-pay | Admitting: Family Medicine

## 2021-12-18 DIAGNOSIS — Z1231 Encounter for screening mammogram for malignant neoplasm of breast: Secondary | ICD-10-CM

## 2021-12-31 ENCOUNTER — Ambulatory Visit
Admission: RE | Admit: 2021-12-31 | Discharge: 2021-12-31 | Disposition: A | Discharge status: Home / Self Care | Payer: Medicare Other | Source: Ambulatory Visit | Attending: Family Medicine | Admitting: Family Medicine

## 2021-12-31 DIAGNOSIS — Z1231 Encounter for screening mammogram for malignant neoplasm of breast: Secondary | ICD-10-CM | POA: Diagnosis not present

## 2022-02-12 DIAGNOSIS — M65341 Trigger finger, right ring finger: Secondary | ICD-10-CM | POA: Diagnosis not present

## 2022-03-16 ENCOUNTER — Institutional Professional Consult (permissible substitution): Payer: Medicare Other | Admitting: Plastic Surgery

## 2022-04-03 ENCOUNTER — Encounter: Payer: Self-pay | Admitting: Plastic Surgery

## 2022-04-03 ENCOUNTER — Institutional Professional Consult (permissible substitution): Payer: Medicare Other | Admitting: Plastic Surgery

## 2022-04-03 ENCOUNTER — Ambulatory Visit: Payer: Medicare Other | Admitting: Plastic Surgery

## 2022-04-03 VITALS — BP 137/83 | HR 82 | Ht 60.0 in | Wt 153.2 lb

## 2022-04-03 DIAGNOSIS — L723 Sebaceous cyst: Secondary | ICD-10-CM

## 2022-04-03 NOTE — Progress Notes (Signed)
Referring Provider Antony Contras, MD [address],  [place] [address]   CC:  Chief Complaint  Patient presents with   Advice Only      Stephanie Roth is an 66 y.o. female.  HPI: Stephanie Roth is a very pleasant 65 year old female who is referred for evaluation management of a cyst on the left temporal region.  The cyst has been there for at least 6 years she had an ultrasound in the past which showed a simple cyst with no evidence of surrounding complicating issues.  He would like to have it removed.  Allergies  Allergen Reactions   Monosodium Glutamate Swelling    Facial swelling    Outpatient Encounter Medications as of 04/03/2022  Medication Sig   amLODipine (NORVASC) 2.5 MG tablet TK 1 T PO QD   ascorbic acid (VITAMIN C) 500 MG tablet Vitamin C 500 mg tablet  Take by oral route.   atropine 1 % ophthalmic solution atropine 1 % eye drops   Cholecalciferol (VITAMIN D3) 2000 units TABS Take 1 tablet by mouth daily.   COD LIVER OIL PO Take 1 capsule by mouth daily.   fexofenadine (ALLEGRA) 180 MG tablet Take 180 mg by mouth daily.   Flaxseed, Linseed, (FLAXSEED OIL PO) Take 1,300 mg by mouth 2 (two) times daily.   HONEY PO Take 1 each by mouth as needed.   Multiple Vitamins-Minerals (ICAPS AREDS FORMULA PO) Take by mouth.   Multiple Vitamins-Minerals (WOMENS MULTIVITAMIN PO) Take 1 tablet by mouth daily.   No facility-administered encounter medications on file as of 04/03/2022.     Past Medical History:  Diagnosis Date   Diabetes mellitus    DUB (dysfunctional uterine bleeding)    Fibroids    uterine   Goiter 2009   Hyperthyroidism    Iron deficiency anemia    Joint pain    Menopause     Past Surgical History:  Procedure Laterality Date   CESAREAN SECTION     ECTOPIC PREGNANCY SURGERY     GUM SURGERY     TUBAL LIGATION      Family History  Problem Relation Age of Onset   Hypertension Mother    COPD Mother    Congestive Heart Failure  Mother    Arthritis Mother    Hypertension Father    Aortic aneurysm Father    Psoriasis Sister    Eczema Sister    Arthritis Sister     Social History   Social History Narrative   Not on file     Review of Systems General: Denies fevers, chills, weight loss CV: Denies chest pain, shortness of breath, palpitations Skin: Easily palpable cyst in the left temporal region which she states has been present for many years.  No history of trauma to the region.  No drainage from the cyst.  Physical Exam    04/03/2022    9:51 AM 04/07/2021    9:42 AM 08/02/2019    1:13 PM  Vitals with BMI  Height '5\' 0"'$  '5\' 0"'$  '5\' 0"'$   Weight 153 lbs 3 oz 148 lbs 10 oz 159 lbs 4 oz  BMI 29.92 73.42 87.6  Systolic 811 572 620  Diastolic 83 88 84  Pulse 82 74 79    General:  No acute distress,  Alert and oriented, Non-Toxic, Normal speech and affect Integument: There is an easily palpable 4 to 5 cm cyst in the left temporal region it does not feel fixed to  any underlying tissue and is very much consistent with a sebaceous cyst.  There is a small punctum over the cyst.  No drainage or surrounding erythema.  Assessment/Plan Sebaceous cyst: We will plan for excision in the clinic under local.  The patient understands that there is a risk of damage to surrounding structures.  Believe this risk is minimal at best.  She will have sutures which will need to be removed in 5 to 7 days postoperatively.  We will schedule her for my next available clinic procedure appointment.  Stephanie Roth 04/03/2022, 10:26 AM

## 2022-04-13 ENCOUNTER — Other Ambulatory Visit: Payer: Medicare Other

## 2022-04-20 ENCOUNTER — Telehealth: Payer: Self-pay | Admitting: Plastic Surgery

## 2022-04-20 NOTE — Telephone Encounter (Signed)
LVM for pt to return call from message she left on our voicemail.  She wants to r/s her 11/8 appt due to being under the weather.

## 2022-04-22 ENCOUNTER — Ambulatory Visit: Payer: Medicare Other | Admitting: Plastic Surgery

## 2022-04-22 ENCOUNTER — Telehealth: Payer: Self-pay | Admitting: Plastic Surgery

## 2022-04-22 NOTE — Telephone Encounter (Signed)
LVM checking on pt since she missed her procedure appt at 3pm today.  Advised pt to call the office when available.

## 2022-06-22 DIAGNOSIS — H02423 Myogenic ptosis of bilateral eyelids: Secondary | ICD-10-CM | POA: Diagnosis not present

## 2022-06-30 DIAGNOSIS — H02421 Myogenic ptosis of right eyelid: Secondary | ICD-10-CM | POA: Diagnosis not present

## 2022-06-30 DIAGNOSIS — H02423 Myogenic ptosis of bilateral eyelids: Secondary | ICD-10-CM | POA: Diagnosis not present

## 2022-06-30 DIAGNOSIS — H02422 Myogenic ptosis of left eyelid: Secondary | ICD-10-CM | POA: Diagnosis not present

## 2022-08-11 DIAGNOSIS — L508 Other urticaria: Secondary | ICD-10-CM | POA: Diagnosis not present

## 2022-08-11 DIAGNOSIS — E7800 Pure hypercholesterolemia, unspecified: Secondary | ICD-10-CM | POA: Diagnosis not present

## 2022-08-11 DIAGNOSIS — I10 Essential (primary) hypertension: Secondary | ICD-10-CM | POA: Diagnosis not present

## 2022-08-11 DIAGNOSIS — G4733 Obstructive sleep apnea (adult) (pediatric): Secondary | ICD-10-CM | POA: Diagnosis not present

## 2022-09-08 ENCOUNTER — Other Ambulatory Visit: Payer: Self-pay | Admitting: Endocrinology

## 2022-09-08 DIAGNOSIS — E041 Nontoxic single thyroid nodule: Secondary | ICD-10-CM

## 2022-09-23 ENCOUNTER — Other Ambulatory Visit: Payer: Self-pay | Admitting: Family Medicine

## 2022-09-23 DIAGNOSIS — E2839 Other primary ovarian failure: Secondary | ICD-10-CM

## 2022-09-28 ENCOUNTER — Other Ambulatory Visit: Payer: Medicare Other

## 2022-10-21 ENCOUNTER — Ambulatory Visit
Admission: RE | Admit: 2022-10-21 | Discharge: 2022-10-21 | Disposition: A | Discharge status: Home / Self Care | Payer: Medicare Other | Source: Ambulatory Visit | Attending: Endocrinology | Admitting: Endocrinology

## 2022-10-21 DIAGNOSIS — R911 Solitary pulmonary nodule: Secondary | ICD-10-CM | POA: Diagnosis not present

## 2022-10-21 DIAGNOSIS — E041 Nontoxic single thyroid nodule: Secondary | ICD-10-CM

## 2022-10-27 DIAGNOSIS — E0590 Thyrotoxicosis, unspecified without thyrotoxic crisis or storm: Secondary | ICD-10-CM | POA: Diagnosis not present

## 2022-10-27 DIAGNOSIS — I10 Essential (primary) hypertension: Secondary | ICD-10-CM | POA: Diagnosis not present

## 2022-10-27 DIAGNOSIS — E041 Nontoxic single thyroid nodule: Secondary | ICD-10-CM | POA: Diagnosis not present

## 2022-10-27 DIAGNOSIS — H0520 Unspecified exophthalmos: Secondary | ICD-10-CM | POA: Diagnosis not present

## 2022-11-15 DIAGNOSIS — M25572 Pain in left ankle and joints of left foot: Secondary | ICD-10-CM | POA: Diagnosis not present

## 2023-01-06 ENCOUNTER — Other Ambulatory Visit: Payer: Self-pay | Admitting: Family Medicine

## 2023-01-06 DIAGNOSIS — Z1231 Encounter for screening mammogram for malignant neoplasm of breast: Secondary | ICD-10-CM

## 2023-01-20 ENCOUNTER — Ambulatory Visit
Admission: RE | Admit: 2023-01-20 | Discharge: 2023-01-20 | Disposition: A | Discharge status: Home / Self Care | Payer: Medicare Other | Source: Ambulatory Visit | Attending: Family Medicine | Admitting: Family Medicine

## 2023-01-20 DIAGNOSIS — Z1231 Encounter for screening mammogram for malignant neoplasm of breast: Secondary | ICD-10-CM | POA: Diagnosis not present

## 2023-01-25 ENCOUNTER — Other Ambulatory Visit: Payer: Self-pay | Admitting: Family Medicine

## 2023-01-25 DIAGNOSIS — R928 Other abnormal and inconclusive findings on diagnostic imaging of breast: Secondary | ICD-10-CM

## 2023-02-04 ENCOUNTER — Ambulatory Visit
Admission: RE | Admit: 2023-02-04 | Discharge: 2023-02-04 | Disposition: A | Discharge status: Home / Self Care | Payer: Medicare Other | Source: Ambulatory Visit | Attending: Family Medicine | Admitting: Family Medicine

## 2023-02-04 ENCOUNTER — Other Ambulatory Visit: Payer: Self-pay | Admitting: Family Medicine

## 2023-02-04 DIAGNOSIS — R928 Other abnormal and inconclusive findings on diagnostic imaging of breast: Secondary | ICD-10-CM

## 2023-02-04 DIAGNOSIS — R921 Mammographic calcification found on diagnostic imaging of breast: Secondary | ICD-10-CM

## 2023-02-08 ENCOUNTER — Ambulatory Visit
Admission: RE | Admit: 2023-02-08 | Discharge: 2023-02-08 | Disposition: A | Discharge status: Home / Self Care | Payer: Medicare Other | Source: Ambulatory Visit | Attending: Family Medicine | Admitting: Family Medicine

## 2023-02-08 DIAGNOSIS — R921 Mammographic calcification found on diagnostic imaging of breast: Secondary | ICD-10-CM

## 2023-02-08 DIAGNOSIS — D0511 Intraductal carcinoma in situ of right breast: Secondary | ICD-10-CM | POA: Diagnosis not present

## 2023-02-08 HISTORY — PX: BREAST BIOPSY: SHX20

## 2023-02-11 DIAGNOSIS — G4733 Obstructive sleep apnea (adult) (pediatric): Secondary | ICD-10-CM | POA: Diagnosis not present

## 2023-02-11 DIAGNOSIS — D0511 Intraductal carcinoma in situ of right breast: Secondary | ICD-10-CM | POA: Diagnosis not present

## 2023-02-11 DIAGNOSIS — E7800 Pure hypercholesterolemia, unspecified: Secondary | ICD-10-CM | POA: Diagnosis not present

## 2023-02-11 DIAGNOSIS — I10 Essential (primary) hypertension: Secondary | ICD-10-CM | POA: Diagnosis not present

## 2023-02-11 DIAGNOSIS — L508 Other urticaria: Secondary | ICD-10-CM | POA: Diagnosis not present

## 2023-02-14 DIAGNOSIS — C801 Malignant (primary) neoplasm, unspecified: Secondary | ICD-10-CM

## 2023-02-14 HISTORY — DX: Malignant (primary) neoplasm, unspecified: C80.1

## 2023-02-16 ENCOUNTER — Encounter: Payer: Self-pay | Admitting: *Deleted

## 2023-02-16 DIAGNOSIS — D0511 Intraductal carcinoma in situ of right breast: Secondary | ICD-10-CM | POA: Insufficient documentation

## 2023-02-16 NOTE — Progress Notes (Signed)
Radiation Oncology         [phone] ________________________________  Name: Stephanie Roth        MRN: [id number]  Date of Service: 02/17/2023 DOB: 17-May-1956  NW:[id number], Onalee Hua, MD  Griselda Miner, MD     REFERRING PHYSICIAN: [name], MD   DIAGNOSIS: The encounter diagnosis was Ductal carcinoma in situ (DCIS) of right breast.   HISTORY OF PRESENT ILLNESS: Stephanie Roth is a 67 y.o. female seen in the multidisciplinary breast clinic for a new diagnosis of right breast cancer. The patient was noted to have a screening mammogram that identified calcifications and a possible mass in the right breast.  She underwent further diagnostic imaging which showed a broad span of calcifications at least 9.5 cm in the right breast with no evidence of adenopathy in the axilla by ultrasound.  She underwent biopsies on 02/08/2023 that showed high-grade DCIS in the anterior aspect of the upper outer quadrant as well as within the specimen labeled calcs.  The calcification biopsy was suspicious for microinvasion.  Her cancer was ER/PR negative.  She is seen today to discuss treatment recommendations of her cancer.Marland Kitchen    PREVIOUS RADIATION THERAPY: No   PAST MEDICAL HISTORY:  Past Medical History:  Diagnosis Date   Diabetes mellitus    DUB (dysfunctional uterine bleeding)    Fibroids    uterine   Goiter 2009   Hyperthyroidism    Iron deficiency anemia    Joint pain    Menopause        PAST SURGICAL HISTORY: Past Surgical History:  Procedure Laterality Date   BREAST BIOPSY Right 02/08/2023   MM RT BREAST BX W LOC DEV 1ST LESION IMAGE BX SPEC STEREO GUIDE 02/08/2023 [facility]   BREAST BIOPSY Right 02/08/2023   MM RT BREAST BX W LOC DEV EA AD LESION IMG BX SPEC STEREO GUIDE 02/08/2023 [facility]   CESAREAN SECTION     ECTOPIC PREGNANCY SURGERY     GUM SURGERY     TUBAL LIGATION       FAMILY HISTORY:  Family History  Problem Relation Age of Onset   Hypertension  Mother    COPD Mother    Congestive Heart Failure Mother    Arthritis Mother    Hypertension Father    Aortic aneurysm Father    Psoriasis Sister    Eczema Sister    Arthritis Sister      SOCIAL HISTORY:  reports that she has never smoked. She has never used smokeless tobacco. She reports that she does not drink alcohol and does not use drugs. The patient is divorced and lives in  [place].    ALLERGIES: Monosodium glutamate   MEDICATIONS:  Current Outpatient Medications  Medication Sig Dispense Refill   amLODipine (NORVASC) 2.5 MG tablet TK 1 T PO QD     ascorbic acid (VITAMIN C) 500 MG tablet Vitamin C 500 mg tablet  Take by oral route.     atropine 1 % ophthalmic solution atropine 1 % eye drops     Cholecalciferol (VITAMIN D3) 2000 units TABS Take 1 tablet by mouth daily.     COD LIVER OIL PO Take 1 capsule by mouth daily.     fexofenadine (ALLEGRA) 180 MG tablet Take 180 mg by mouth daily.     Flaxseed, Linseed, (FLAXSEED OIL PO) Take 1,300 mg by mouth 2 (two) times daily.     HONEY PO Take 1 each by mouth as needed.  Multiple Vitamins-Minerals (ICAPS AREDS FORMULA PO) Take by mouth.     Multiple Vitamins-Minerals (WOMENS MULTIVITAMIN PO) Take 1 tablet by mouth daily.     No current facility-administered medications for this visit.     REVIEW OF SYSTEMS: On review of systems, the patient reports that she is doing well overall. No breast specific complaints are verbalized.      PHYSICAL EXAM:  Wt Readings from Last 3 Encounters:  04/03/22 153 lb 3.2 oz (69.5 kg)  04/07/21 148 lb 9.6 oz (67.4 kg)  08/02/19 159 lb 4 oz (72.2 kg)   Temp Readings from Last 3 Encounters:  08/02/19 (!) 97 F (36.1 C)   BP Readings from Last 3 Encounters:  04/03/22 137/83  04/07/21 (!) 152/88  08/02/19 124/84   Pulse Readings from Last 3 Encounters:  04/03/22 82  04/07/21 74  08/02/19 79    In general this is a well appearing African American female in no acute distress.  She's alert and oriented x4 and appropriate throughout the examination. Cardiopulmonary assessment is negative for acute distress and she exhibits normal effort. Bilateral breast exam is deferred.    ECOG = 1  0 - Asymptomatic (Fully active, able to carry on all predisease activities without restriction)  1 - Symptomatic but completely ambulatory (Restricted in physically strenuous activity but ambulatory and able to carry out work of a light or sedentary nature. For example, light housework, office work)  2 - Symptomatic, <50% in bed during the day (Ambulatory and capable of all self care but unable to carry out any work activities. Up and about more than 50% of waking hours)  3 - Symptomatic, >50% in bed, but not bedbound (Capable of only limited self-care, confined to bed or chair 50% or more of waking hours)  4 - Bedbound (Completely disabled. Cannot carry on any self-care. Totally confined to bed or chair)  5 - Death   Santiago Glad MM, Creech RH, Tormey DC, et al. [phone]). "Toxicity and response criteria of the [facility]". Am. Evlyn Clines. Oncol. 5 (6): 649-55    LABORATORY DATA:  Lab Results  Component Value Date   WBC 7.1 03/03/2018   HGB 13.6 03/03/2018   HCT 41.9 03/03/2018   MCV 92 03/03/2018   Lab Results  Component Value Date   NA 143 03/03/2018   K 4.7 03/03/2018   CL 104 03/03/2018   CO2 23 03/03/2018   Lab Results  Component Value Date   ALT 22 03/03/2018   AST 21 03/03/2018   ALKPHOS 100 03/03/2018   BILITOT 0.2 03/03/2018      RADIOGRAPHY: MM RT BREAST BX W LOC DEV 1ST LESION IMAGE BX SPEC STEREO GUIDE  Addendum Date: 02/10/2023   ADDENDUM REPORT: 02/10/2023 08:32 ADDENDUM: Pathology revealed DUCTAL CARCINOMA IN SITU, HIGH GRADE, SOLID TYPE, NECROSIS: PRESENT, CALCIFICATIONS: PRESENT of the RIGHT breast, upper outer anterior (coil clip). This was found to be concordant by Dr. Annia Belt. Pathology revealed DUCTAL CARCINOMA IN SITU, HIGH  GRADE, SOLID TYPE, SUSPICIOUS FOR MICROINVASION, NECROSIS: PRESENT, CALCIFICATIONS: PRESENT of the RIGHT breast, upper outer posterior, (ribbon clip). This was found to be concordant by Dr. Annia Belt. Pathology results were discussed with the patient by telephone. The patient reported doing well after the biopsies with tenderness at the sites. Post biopsy instructions and care were reviewed and questions were answered. The patient was encouraged to call [facility] for any additional concerns. My direct phone number was provided.  The patient was referred to [facility] on February 17, 2023. Consideration for a bilateral breast MRI for further evaluation of extent of disease given the high grade histology. Pathology results reported by Rene Kocher, RN on 02/09/2023. Electronically Signed   By: Annia Belt M.D.   On: 02/10/2023 08:32   Result Date: 02/10/2023 CLINICAL DATA:  Patient with large area of indeterminate calcifications upper-outer right breast. EXAM: RIGHT BREAST STEREOTACTIC CORE NEEDLE BIOPSY COMPARISON:  Previous exam(s). FINDINGS: The patient and I discussed the procedure of stereotactic-guided biopsy including benefits and alternatives. We discussed the high likelihood of a successful procedure. We discussed the risks of the procedure including infection, bleeding, tissue injury, clip migration, and inadequate sampling. Informed written consent was given. The usual time out protocol was performed immediately prior to the procedure. Site 1: Upper-outer right breast anterior: Coil clip Using sterile technique and 1% Lidocaine as local anesthetic, under stereotactic guidance, a 9 gauge vacuum assisted device was used to perform core needle biopsy of calcifications anterior upper outer right breast using a cranial approach. Specimen radiograph was performed showing calcifications. Specimens with  calcifications are identified for pathology. Lesion quadrant: Upper outer quadrant At the conclusion of the procedure, coil shaped tissue marker clip was deployed into the biopsy cavity. Follow-up 2-view mammogram was performed and dictated separately. Site 2: Posterior: Ribbon clip Using sterile technique and 1% Lidocaine as local anesthetic, under stereotactic guidance, a 9 gauge vacuum assisted device was used to perform core needle biopsy of posterior right breast calcifications using a cranial approach. Specimen radiograph was performed showing calcifications. Specimens with calcifications are identified for pathology. Lesion quadrant: Upper outer quadrant At the conclusion of the procedure, ribbon shaped tissue marker clip was deployed into the biopsy cavity. Follow-up 2-view mammogram was performed and dictated separately. IMPRESSION: Stereotactic-guided biopsy of right breast calcifications, 2 sites. No apparent complications. Electronically Signed: By: Annia Belt M.D. On: 02/08/2023 11:29   MM RT BREAST BX W LOC DEV EA AD LESION IMG BX SPEC STEREO GUIDE  Addendum Date: 02/10/2023   ADDENDUM REPORT: 02/10/2023 08:32 ADDENDUM: Pathology revealed DUCTAL CARCINOMA IN SITU, HIGH GRADE, SOLID TYPE, NECROSIS: PRESENT, CALCIFICATIONS: PRESENT of the RIGHT breast, upper outer anterior (coil clip). This was found to be concordant by Dr. Annia Belt. Pathology revealed DUCTAL CARCINOMA IN SITU, HIGH GRADE, SOLID TYPE, SUSPICIOUS FOR MICROINVASION, NECROSIS: PRESENT, CALCIFICATIONS: PRESENT of the RIGHT breast, upper outer posterior, (ribbon clip). This was found to be concordant by Dr. Annia Belt. Pathology results were discussed with the patient by telephone. The patient reported doing well after the biopsies with tenderness at the sites. Post biopsy instructions and care were reviewed and questions were answered. The patient was encouraged to call [facility] for any additional  concerns. My direct phone number was provided. The patient was referred to [facility] on February 17, 2023. Consideration for a bilateral breast MRI for further evaluation of extent of disease given the high grade histology. Pathology results reported by Rene Kocher, RN on 02/09/2023. Electronically Signed   By: Annia Belt M.D.   On: 02/10/2023 08:32   Result Date: 02/10/2023 CLINICAL DATA:  Patient with large area of indeterminate calcifications upper-outer right breast. EXAM: RIGHT BREAST STEREOTACTIC CORE NEEDLE BIOPSY COMPARISON:  Previous exam(s). FINDINGS: The patient and I discussed the procedure of stereotactic-guided biopsy including benefits and alternatives.  We discussed the high likelihood of a successful procedure. We discussed the risks of the procedure including infection, bleeding, tissue injury, clip migration, and inadequate sampling. Informed written consent was given. The usual time out protocol was performed immediately prior to the procedure. Site 1: Upper-outer right breast anterior: Coil clip Using sterile technique and 1% Lidocaine as local anesthetic, under stereotactic guidance, a 9 gauge vacuum assisted device was used to perform core needle biopsy of calcifications anterior upper outer right breast using a cranial approach. Specimen radiograph was performed showing calcifications. Specimens with calcifications are identified for pathology. Lesion quadrant: Upper outer quadrant At the conclusion of the procedure, coil shaped tissue marker clip was deployed into the biopsy cavity. Follow-up 2-view mammogram was performed and dictated separately. Site 2: Posterior: Ribbon clip Using sterile technique and 1% Lidocaine as local anesthetic, under stereotactic guidance, a 9 gauge vacuum assisted device was used to perform core needle biopsy of posterior right breast calcifications using a cranial approach. Specimen  radiograph was performed showing calcifications. Specimens with calcifications are identified for pathology. Lesion quadrant: Upper outer quadrant At the conclusion of the procedure, ribbon shaped tissue marker clip was deployed into the biopsy cavity. Follow-up 2-view mammogram was performed and dictated separately. IMPRESSION: Stereotactic-guided biopsy of right breast calcifications, 2 sites. No apparent complications. Electronically Signed: By: Annia Belt M.D. On: 02/08/2023 11:29   MM CLIP PLACEMENT RIGHT  Result Date: 02/08/2023 CLINICAL DATA:  Status post stereo biopsy right breast 2 sites EXAM: 3D DIAGNOSTIC RIGHT MAMMOGRAM POST STEREOTACTIC BIOPSY COMPARISON:  Previous exam(s). FINDINGS: 3D Mammographic images were obtained following stereotactic guided biopsy of right breast calcifications, 2 sites. Site 1: Upper-outer anterior: Coil clip: In appropriate position. Site 2: Posterior: Ribbon clip: In appropriate position. IMPRESSION: Appropriate positioning of the biopsy marking clips as above. Final Assessment: Post Procedure Mammograms for Marker Placement Electronically Signed   By: Annia Belt M.D.   On: 02/08/2023 11:31   MM 3D DIAGNOSTIC MAMMOGRAM UNILATERAL RIGHT BREAST  Result Date: 02/04/2023 CLINICAL DATA:  Screening recall for right breast calcifications and a possible mass. EXAM: DIGITAL DIAGNOSTIC UNILATERAL RIGHT MAMMOGRAM WITH TOMOSYNTHESIS AND CAD; ULTRASOUND RIGHT BREAST LIMITED TECHNIQUE: Right digital diagnostic mammography and breast tomosynthesis was performed. The images were evaluated with computer-aided detection. ; Targeted ultrasound examination of the right breast was performed COMPARISON:  Previous exam(s). ACR Breast Density Category a: The breasts are almost entirely fatty. FINDINGS: In the upper-outer quadrant of the right breast, there is a broad span of segmental branching pleomorphic calcifications. The calcifications span at least 9.5 cm in the anterior to  posterior dimension, and approximately 8.7 cm in the medial to lateral dimension. Ultrasound targeted to the outer upper outer quadrant of the right breast demonstrates no discrete masses. Ultrasound of the right axilla demonstrates multiple normal-appearing lymph nodes. IMPRESSION: 1. There is a broad span of suspicious segmental calcifications in the upper-outer quadrant of the right breast spanning at least 9.5 cm. 2.  No evidence of right axillary lymphadenopathy. RECOMMENDATION: Stereotactic biopsy is recommended for 2 distant sites of calcifications. It is suggested that a posterolateral and posteromedial location the sampled. The procedures scheduled for 02/08/2023 at 10:30 a.m. I have discussed the findings and recommendations with the patient. If applicable, a reminder letter will be sent to the patient regarding the next appointment. BI-RADS CATEGORY  5: Highly suggestive of malignancy. Electronically Signed   By: Frederico Hamman M.D.   On: 02/04/2023 12:44   [place] LIMITED ULTRASOUND INCLUDING AXILLA  RIGHT BREAST  Result Date: 02/04/2023 CLINICAL DATA:  Screening recall for right breast calcifications and a possible mass. EXAM: DIGITAL DIAGNOSTIC UNILATERAL RIGHT MAMMOGRAM WITH TOMOSYNTHESIS AND CAD; ULTRASOUND RIGHT BREAST LIMITED TECHNIQUE: Right digital diagnostic mammography and breast tomosynthesis was performed. The images were evaluated with computer-aided detection. ; Targeted ultrasound examination of the right breast was performed COMPARISON:  Previous exam(s). ACR Breast Density Category a: The breasts are almost entirely fatty. FINDINGS: In the upper-outer quadrant of the right breast, there is a broad span of segmental branching pleomorphic calcifications. The calcifications span at least 9.5 cm in the anterior to posterior dimension, and approximately 8.7 cm in the medial to lateral dimension. Ultrasound targeted to the outer upper outer quadrant of the right breast demonstrates no  discrete masses. Ultrasound of the right axilla demonstrates multiple normal-appearing lymph nodes. IMPRESSION: 1. There is a broad span of suspicious segmental calcifications in the upper-outer quadrant of the right breast spanning at least 9.5 cm. 2.  No evidence of right axillary lymphadenopathy. RECOMMENDATION: Stereotactic biopsy is recommended for 2 distant sites of calcifications. It is suggested that a posterolateral and posteromedial location the sampled. The procedures scheduled for 02/08/2023 at 10:30 a.m. I have discussed the findings and recommendations with the patient. If applicable, a reminder letter will be sent to the patient regarding the next appointment. BI-RADS CATEGORY  5: Highly suggestive of malignancy. Electronically Signed   By: Frederico Hamman M.D.   On: 02/04/2023 12:44   MM 3D SCREENING MAMMOGRAM BILATERAL BREAST  Result Date: 01/22/2023 CLINICAL DATA:  Screening. EXAM: DIGITAL SCREENING BILATERAL MAMMOGRAM WITH TOMOSYNTHESIS AND CAD TECHNIQUE: Bilateral screening digital craniocaudal and mediolateral oblique mammograms were obtained. Bilateral screening digital breast tomosynthesis was performed. The images were evaluated with computer-aided detection. COMPARISON:  Previous exam(s). ACR Breast Density Category a: The breasts are almost entirely fatty. FINDINGS: In the right breast, segmental calcifications and a possible mass warrants further evaluation. In the left breast, no findings suspicious for malignancy. IMPRESSION: Further evaluation is suggested segmental calcifications and a possible mass in the right breast. RECOMMENDATION: Diagnostic mammogram and possibly ultrasound of the right breast. (Code:[id number]) The patient will be contacted regarding the findings, and additional imaging will be scheduled. BI-RADS CATEGORY  0: Incomplete: Need additional imaging evaluation. Electronically Signed   By: Sande Brothers M.D.   On: 01/22/2023 13:48       IMPRESSION/PLAN: 1. At  least high-grade ER/PR negative DCIS with possible microinvasion of the right breast. Dr. Mitzi Hansen discusses the pathology findings and reviews the nature of noninvasive breast disease. The consensus from the breast conference includes mastectomy with sentinel node biopsy given the concerns for microinvasion. We discussed  *** 2. Possible genetic predisposition to malignancy. The patient is a candidate for genetic testing given her personal  history. She will meet with our geneticist today in clinic.   In a visit lasting *** minutes, greater than 50% of the time was spent face to face reviewing her case, as well as in preparation of, discussing, and coordinating the patient's care.  The above documentation reflects my direct findings during this shared patient visit. Please see the separate note by Dr. Mitzi Hansen on this date for the remainder of the patient's plan of care.    Osker Mason, [facility]    **Disclaimer: This note was dictated with voice recognition software. Similar sounding words can inadvertently be transcribed and this note may contain transcription errors which may not have been corrected upon publication of note.**

## 2023-02-17 ENCOUNTER — Encounter: Payer: Self-pay | Admitting: Radiation Oncology

## 2023-02-17 ENCOUNTER — Inpatient Hospital Stay: Payer: Medicare Other | Attending: Hematology and Oncology | Admitting: Hematology and Oncology

## 2023-02-17 ENCOUNTER — Institutional Professional Consult (permissible substitution): Payer: Medicare Other | Admitting: Plastic Surgery

## 2023-02-17 ENCOUNTER — Ambulatory Visit: Payer: Medicare Other | Attending: General Surgery | Admitting: Physical Therapy

## 2023-02-17 ENCOUNTER — Encounter: Payer: Self-pay | Admitting: Physical Therapy

## 2023-02-17 ENCOUNTER — Inpatient Hospital Stay: Payer: Medicare Other

## 2023-02-17 ENCOUNTER — Encounter: Payer: Self-pay | Admitting: *Deleted

## 2023-02-17 ENCOUNTER — Other Ambulatory Visit: Payer: Self-pay

## 2023-02-17 ENCOUNTER — Inpatient Hospital Stay: Payer: Medicare Other | Admitting: Genetic Counselor

## 2023-02-17 ENCOUNTER — Ambulatory Visit
Admission: RE | Admit: 2023-02-17 | Discharge: 2023-02-17 | Disposition: A | Discharge status: Home / Self Care | Payer: Medicare Other | Source: Ambulatory Visit | Attending: Radiation Oncology | Admitting: Radiation Oncology

## 2023-02-17 VITALS — BP 157/88 | HR 83 | Temp 97.2°F | Resp 19 | Ht 60.0 in | Wt 153.8 lb

## 2023-02-17 DIAGNOSIS — Z800 Family history of malignant neoplasm of digestive organs: Secondary | ICD-10-CM

## 2023-02-17 DIAGNOSIS — Z79899 Other long term (current) drug therapy: Secondary | ICD-10-CM | POA: Insufficient documentation

## 2023-02-17 DIAGNOSIS — Z171 Estrogen receptor negative status [ER-]: Secondary | ICD-10-CM | POA: Insufficient documentation

## 2023-02-17 DIAGNOSIS — D0511 Intraductal carcinoma in situ of right breast: Secondary | ICD-10-CM

## 2023-02-17 DIAGNOSIS — R293 Abnormal posture: Secondary | ICD-10-CM | POA: Insufficient documentation

## 2023-02-17 DIAGNOSIS — C50911 Malignant neoplasm of unspecified site of right female breast: Secondary | ICD-10-CM | POA: Insufficient documentation

## 2023-02-17 DIAGNOSIS — Z8042 Family history of malignant neoplasm of prostate: Secondary | ICD-10-CM

## 2023-02-17 DIAGNOSIS — Z803 Family history of malignant neoplasm of breast: Secondary | ICD-10-CM | POA: Diagnosis not present

## 2023-02-17 HISTORY — DX: Essential (primary) hypertension: I10

## 2023-02-17 LAB — CBC WITH DIFFERENTIAL (CANCER CENTER ONLY)
Abs Immature Granulocytes: 0.02 10*3/uL (ref 0.00–0.07)
Basophils Absolute: 0.1 10*3/uL (ref 0.0–0.1)
Basophils Relative: 1 %
Eosinophils Absolute: 0.1 10*3/uL (ref 0.0–0.5)
Eosinophils Relative: 1 %
HCT: 40.6 % (ref 36.0–46.0)
Hemoglobin: 13.8 g/dL (ref 12.0–15.0)
Immature Granulocytes: 0 %
Lymphocytes Relative: 29 %
Lymphs Abs: 2 10*3/uL (ref 0.7–4.0)
MCH: 31 pg (ref 26.0–34.0)
MCHC: 34 g/dL (ref 30.0–36.0)
MCV: 91.2 fL (ref 80.0–100.0)
Monocytes Absolute: 0.4 10*3/uL (ref 0.1–1.0)
Monocytes Relative: 6 %
Neutro Abs: 4.3 10*3/uL (ref 1.7–7.7)
Neutrophils Relative %: 63 %
Platelet Count: 326 10*3/uL (ref 150–400)
RBC: 4.45 MIL/uL (ref 3.87–5.11)
RDW: 15.4 % (ref 11.5–15.5)
WBC Count: 6.8 10*3/uL (ref 4.0–10.5)
nRBC: 0 % (ref 0.0–0.2)

## 2023-02-17 LAB — CMP (CANCER CENTER ONLY)
ALT: 16 U/L (ref 0–44)
AST: 16 U/L (ref 15–41)
Albumin: 4.1 g/dL (ref 3.5–5.0)
Alkaline Phosphatase: 107 U/L (ref 38–126)
Anion gap: 5 (ref 5–15)
BUN: 20 mg/dL (ref 8–23)
CO2: 28 mmol/L (ref 22–32)
Calcium: 9.8 mg/dL (ref 8.9–10.3)
Chloride: 106 mmol/L (ref 98–111)
Creatinine: 0.82 mg/dL (ref 0.44–1.00)
GFR, Estimated: >60 mL/min (ref >60)
Glucose, Bld: 142 mg/dL — ABNORMAL HIGH (ref 70–99)
Potassium: 3.8 mmol/L (ref 3.5–5.1)
Sodium: 139 mmol/L (ref 135–145)
Total Bilirubin: 0.3 mg/dL (ref 0.3–1.2)
Total Protein: 7.9 g/dL (ref 6.5–8.1)

## 2023-02-17 LAB — GENETIC SCREENING ORDER

## 2023-02-17 NOTE — Therapy (Signed)
OUTPATIENT PHYSICAL THERAPY BREAST CANCER BASELINE EVALUATION   Patient Name: Stephanie Roth MRN: [id number] DOB:[birth year], 67 y.o., female Today's Date: 02/17/2023  END OF SESSION:  PT End of Session - 02/17/23 1457     Visit Number 1    Number of Visits 2    Date for PT Re-Evaluation 04/14/23    PT Start Time 1350    PT Stop Time 1416    PT Time Calculation (min) 26 min    Activity Tolerance Patient tolerated treatment well    Behavior During Therapy [facility] for tasks assessed/performed             Past Medical History:  Diagnosis Date   Diabetes mellitus    DUB (dysfunctional uterine bleeding)    Fibroids    uterine   Goiter 06/16/2007   Hypertension    Hyperthyroidism    Iron deficiency anemia    Joint pain    Menopause    Past Surgical History:  Procedure Laterality Date   blepheroplasty Bilateral    BREAST BIOPSY Right 02/08/2023   MM RT BREAST BX W LOC DEV 1ST LESION IMAGE BX SPEC STEREO GUIDE 02/08/2023 [facility]   BREAST BIOPSY Right 02/08/2023   MM RT BREAST BX W LOC DEV EA AD LESION IMG BX SPEC STEREO GUIDE 02/08/2023 [facility]   CESAREAN SECTION     ECTOPIC PREGNANCY SURGERY     GUM SURGERY     TUBAL LIGATION     Patient Active Problem List   Diagnosis Date Noted   Ductal carcinoma in situ (DCIS) of right breast 02/16/2023   History of hyperthyroidism 09/03/2010   Family history of diabetes mellitus 09/03/2010    REFERRING PROVIDER: Dr. Chevis Pretty  REFERRING DIAG: Right breast cancer  THERAPY DIAG:  Ductal carcinoma in situ (DCIS) of right breast  Abnormal posture  Rationale for Evaluation and Treatment: Rehabilitation  ONSET DATE: 01/20/2023  SUBJECTIVE:                                                                                                                                                                                           SUBJECTIVE STATEMENT: Patient reports she is here today to be seen by her medical team  for her newly diagnosed right breast cancer.   PERTINENT HISTORY:  Patient was diagnosed on 01/20/2023 with right high grade DCIS. It measures 9.5 cm. It is ER/PR negative.   PATIENT GOALS:   reduce lymphedema risk and learn post op HEP.   PAIN:  Are you having pain? No  PRECAUTIONS: Active CA   RED FLAGS: None   HAND DOMINANCE: right  WEIGHT BEARING RESTRICTIONS: No  FALLS:  Has patient fallen in last 6 months? No  LIVING ENVIRONMENT: Patient lives with: her sister Lives in: House/apartment Has following equipment at home: None  OCCUPATION: Biomedical engineer[place]  LEISURE: She walks 10,000 steps/day, does some pushups and planks, and sometimes line dances  PRIOR LEVEL OF FUNCTION: Independent   OBJECTIVE:  COGNITION: Overall cognitive status: Within functional limits for tasks assessed    POSTURE:  Forward head and rounded shoulders posture  UPPER EXTREMITY AROM/PROM:  A/PROM RIGHT   eval   Shoulder extension 37  Shoulder flexion 152  Shoulder abduction 155  Shoulder internal rotation 51  Shoulder external rotation 89    (Blank rows = not tested)  A/PROM LEFT   eval  Shoulder extension 40  Shoulder flexion 136  Shoulder abduction 127  Shoulder internal rotation 59  Shoulder external rotation 78    (Blank rows = not tested)  CERVICAL AROM: All within normal limits  UPPER EXTREMITY STRENGTH: WNL  LYMPHEDEMA ASSESSMENTS (in cm):   LANDMARK RIGHT   eval  10 cm proximal to olecranon process 30.4  Olecranon process 23.6  10 cm proximal to ulnar styloid process 21.6  Just proximal to ulnar styloid process 14.6  Across hand at thumb web space 18.2  At base of 2nd digit 6.5  (Blank rows = not tested)  LANDMARK LEFT   eval  10 cm proximal to olecranon process 30.5  Olecranon process 23.3  10 cm proximal to ulnar styloid process 20.8  Just proximal to ulnar styloid process 13.8  Across hand at thumb web space 17.7  At base  of 2nd digit 6.3  (Blank rows = not tested)  L-DEX LYMPHEDEMA SCREENING:  The patient was assessed using the L-Dex machine today to produce a lymphedema index baseline score. The patient will be reassessed on a regular basis (typically every 3 months) to obtain new L-Dex scores. If the score is > 6.5 points away from his/her baseline score indicating onset of subclinical lymphedema, it will be recommended to wear a compression garment for 4 weeks, 12 hours per day and then be reassessed. If the score continues to be > 6.5 points from baseline at reassessment, we will initiate lymphedema treatment. Assessing in this manner has a 95% rate of preventing clinically significant lymphedema.   L-DEX FLOWSHEETS - 02/17/23 1500       L-DEX LYMPHEDEMA SCREENING   Measurement Type Unilateral    L-DEX MEASUREMENT EXTREMITY Upper Extremity    POSITION  Standing    DOMINANT SIDE Right    At Risk Side Right    BASELINE SCORE (UNILATERAL) -1.6             QUICK DASH SURVEY:  Stephanie Roth - 02/17/23 0001     Open a tight or new jar Mild difficulty    Do heavy household chores (wash walls, wash floors) No difficulty    Carry a shopping bag or briefcase No difficulty    Wash your back No difficulty    Use a knife to cut food No difficulty    Recreational activities in which you take some force or impact through your arm, shoulder, or hand (golf, hammering, tennis) No difficulty    During the past week, to what extent has your arm, shoulder or hand problem interfered with your normal social activities with family, friends, neighbors, or groups? Not at all    During the past week, to what extent has your  arm, shoulder or hand problem limited your work or other regular daily activities Not at all    Arm, shoulder, or hand pain. None    Tingling (pins and needles) in your arm, shoulder, or hand None    Difficulty Sleeping No difficulty    DASH Score 2.27 %              PATIENT EDUCATION:   Education details: Lymphedema risk reduction and post op shoulder/posture HEP Person educated: Patient Education method: Explanation, Demonstration, Handout Education comprehension: Patient verbalized understanding and returned demonstration  HOME EXERCISE PROGRAM: Patient was instructed today in a home exercise program today for post op shoulder range of motion. These included active assist shoulder flexion in sitting, scapular retraction, wall walking with shoulder abduction, and hands behind head external rotation.  She was encouraged to do these twice a day, holding 3 seconds and repeating 5 times when permitted by her physician.   ASSESSMENT:  CLINICAL IMPRESSION: Patient was diagnosed on 01/20/2023 with right high grade DCIS. It measures 9.5 cm. It is ER/PR negative. Her multidisciplinary medical team met prior to her assessments to determine a recommended treatment plan. She is planning to have a right mastectomy and sentinel node biopsy with possible reconstruction. She will benefit from a post op PT reassessment to determine needs and from L-Dex screens every 3 months for 2 years to detect subclinical lymphedema.  Pt will benefit from skilled therapeutic intervention to improve on the following deficits: Decreased knowledge of precautions, impaired UE functional use, pain, decreased ROM, postural dysfunction.   PT treatment/interventions: ADL/self-care home management, pt/family education, therapeutic exercise  REHAB POTENTIAL: Excellent  CLINICAL DECISION MAKING: Stable/uncomplicated  EVALUATION COMPLEXITY: Low   GOALS: Goals reviewed with patient? YES  LONG TERM GOALS: (STG=LTG)    Name Target Date Goal status  1 Pt will be able to verbalize understanding of pertinent lymphedema risk reduction practices relevant to her dx specifically related to skin care.  Baseline:  No knowledge 02/17/2023 Achieved at eval  2 Pt will be able to return demo and/or verbalize understanding of  the post op HEP related to regaining shoulder ROM. Baseline:  No knowledge 02/17/2023 Achieved at eval  3 Pt will be able to verbalize understanding of the importance of attending the post op After Breast CA Class for further lymphedema risk reduction education and therapeutic exercise.  Baseline:  No knowledge 02/17/2023 Achieved at eval  4 Pt will demo she has regained full shoulder ROM and function post operatively compared to baselines.  Baseline: See objective measurements taken today. 04/14/2023     PLAN:  PT FREQUENCY/DURATION: EVAL and 1 follow up appointment.   PLAN FOR NEXT SESSION: will reassess 3-4 weeks post op to determine needs.   Patient will follow up at outpatient cancer rehab 3-4 weeks following surgery.  If the patient requires physical therapy at that time, a specific plan will be dictated and sent to the referring physician for approval. The patient was educated today on appropriate basic range of motion exercises to begin post operatively and the importance of attending the After Breast Cancer class following surgery.  Patient was educated today on lymphedema risk reduction practices as it pertains to recommendations that will benefit the patient immediately following surgery.  She verbalized good understanding.    Physical Therapy Information for After Breast Cancer Surgery/Treatment:  Lymphedema is a swelling condition that you may be at risk for in your arm if you have lymph nodes removed from the armpit  area.  After a sentinel node biopsy, the risk is approximately 5-9% and is higher after an axillary node dissection.  There is treatment available for this condition and it is not life-threatening.  Contact your physician or physical therapist with concerns. You may begin the 4 shoulder/posture exercises (see additional sheet) when permitted by your physician (typically a week after surgery).  If you have drains, you may need to wait until those are removed before beginning  range of motion exercises.  A general recommendation is to not lift your arms above shoulder height until drains are removed.  These exercises should be done to your tolerance and gently.  This is not a "no pain/no gain" type of recovery so listen to your body and stretch into the range of motion that you can tolerate, stopping if you have pain.  If you are having immediate reconstruction, ask your plastic surgeon about doing exercises as he or she may want you to wait. We encourage you to attend the free one time ABC (After Breast Cancer) class offered by [facility].  You will learn information related to lymphedema risk, prevention and treatment and additional exercises to regain mobility following surgery.  You can call [phone] for more information.  This is offered the 1st and 3rd Monday of each month.  You only attend the class one time. While undergoing any medical procedure or treatment, try to avoid blood pressure being taken or needle sticks from occurring on the arm on the side of cancer.   This recommendation begins after surgery and continues for the rest of your life.  This may help reduce your risk of getting lymphedema (swelling in your arm). An excellent resource for those seeking information on lymphedema is the National Lymphedema Network's web site. It can be accessed at www.lymphnet.org If you notice swelling in your hand, arm or breast at any time following surgery (even if it is many years from now), please contact your doctor or physical therapist to discuss this.  Lymphedema can be treated at any time but it is easier for you if it is treated early on.  If you feel like your shoulder motion is not returning to normal in a reasonable amount of time, please contact your surgeon or physical therapist.  [facility] [phone]. [address], [place] [address]  ABC CLASS After Breast Cancer Class  After  Breast Cancer Class is a specially designed exercise class to assist you in a safe recover after having breast cancer surgery.  In this class you will learn how to get back to full function whether your drains were just removed or if you had surgery a month ago.  This one-time class is held the 1st and 3rd Monday of every month from 11:00 a.m. until 12:00 noon virtually.  This class is FREE and space is limited. For more information or to register for the next available class, call [phone].  Class Goals  Understand specific stretches to improve the flexibility of you chest and shoulder. Learn ways to safely strengthen your upper body and improve your posture. Understand the warning signs of infection and why you may be at risk for an arm infection. Learn about Lymphedema and prevention.  ** You do not attend this class until after surgery.  Drains must be removed to participate  Patient was instructed today in a home exercise program today for post op shoulder range of motion. These included active  assist shoulder flexion in sitting, scapular retraction, wall walking with shoulder abduction, and hands behind head external rotation.  She was encouraged to do these twice a day, holding 3 seconds and repeating 5 times when permitted by her physician.   Bethann Punches,  02/17/23 5:00 PM

## 2023-02-18 ENCOUNTER — Encounter: Payer: Self-pay | Admitting: Genetic Counselor

## 2023-02-18 ENCOUNTER — Encounter: Payer: Self-pay | Admitting: General Practice

## 2023-02-18 NOTE — Progress Notes (Signed)
[facility] Multidisciplinary Clinic Spiritual Care Note  Met with Stephanie Roth and her sister Stephanie Roth in Breast Multidisciplinary Clinic to introduce Support Center team/resources.  she completed SDOH screening; results follow below.    SDOH Screenings   Food Insecurity: No Food Insecurity (02/18/2023)  Housing: Low Risk  (02/18/2023)  Transportation Needs: No Transportation Needs (02/18/2023)  Utilities: Not At Risk (02/18/2023)  Depression (PHQ2-9): Low Risk  (02/18/2023)  Tobacco Use: Low Risk  (02/17/2023)    Chaplain and patient discussed common feelings and emotions when being diagnosed with cancer, and the importance of support during treatment.  Chaplain informed patient of the support team and support services at [facility].  Chaplain provided contact information and encouraged patient to call with any questions or concerns.  Ms Offutt describes herself as a private person and may decide to disclose her diagnosis to limited people. Faith and prayer are important parts of her meaning-making and coping.   Follow up needed: Yes.  Plan to phone in two weeks for pastoral check-in per Ms Bossler's request.   Konrad Dolores, [facility] Pager [phone] Voicemail [phone]

## 2023-02-18 NOTE — Progress Notes (Signed)
[facility] CONSULT NOTE  Patient Care Team: Tally Joe, MD as PCP - General (Family Medicine) Donnelly Angelica, RN as Oncology Nurse Navigator Pershing Proud, RN as Oncology Nurse Navigator Serena Croissant, MD as Consulting Physician (Hematology and Oncology) Dorothy Puffer, MD as Consulting Physician (Radiation Oncology) Griselda Miner, MD as Consulting Physician (General Surgery)  CHIEF COMPLAINTS/PURPOSE OF CONSULTATION:  Right breast DCIS  HISTORY OF PRESENTING ILLNESS:  Stephanie Roth 67 y.o. female is here because of recent diagnosis of right breast DCIS.  Patient had a routine screening mammogram that detected calcifications in her breast spanning 9.5 x 8.7 cm.  2 biopsies were performed both of which were similar appearing high-grade DCIS with foci of microinvasion ER 0%, PR 0%.  These calcifications were linear and branching in nature.  She was presented this morning at the multidisciplinary tumor board and she is here today accompanied by her family to discuss treatment plan.  I reviewed her records extensively and collaborated the history with the patient.  SUMMARY OF ONCOLOGIC HISTORY: Oncology History  Ductal carcinoma in situ (DCIS) of right breast  02/08/2023 Initial Diagnosis   Screening detected right breast calcifications spanning 9.5 cm.  2 biopsies performed anterior and posterior reveal high-grade DCIS with foci suspicious for microinvasion ER 0%, PR 0%, axilla negative      MEDICAL HISTORY:  Past Medical History:  Diagnosis Date   Diabetes mellitus    DUB (dysfunctional uterine bleeding)    Fibroids    uterine   Goiter 06/16/2007   Hypertension    Hyperthyroidism    Iron deficiency anemia    Joint pain    Menopause     SURGICAL HISTORY: Past Surgical History:  Procedure Laterality Date   blepheroplasty Bilateral    BREAST BIOPSY Right 02/08/2023   MM RT BREAST BX W LOC DEV 1ST LESION IMAGE BX SPEC STEREO GUIDE 02/08/2023 [facility]  MAMMOGRAPHY   BREAST BIOPSY Right 02/08/2023   MM RT BREAST BX W LOC DEV EA AD LESION IMG BX SPEC STEREO GUIDE 02/08/2023 [facility]AMMO[facility]   CESAREAN SECTION     ECTOPIC PREGNANCY SURGERY     GUM SURGERY     TUBAL LIGATION      SOCIAL HISTORY: Social History   Socioeconomic History   Marital status: Divorced    Spouse name: Not on file   Number of children: Not on file   Years of education: Not on file   Highest education level: Not on file  Occupational History   Not on file  Tobacco Use   Smoking status: Never   Smokeless tobacco: Never  Vaping Use   Vaping status: Never Used  Substance and Sexual Activity   Alcohol use: Never   Drug use: Never   Sexual activity: Not on file  Other Topics Concern   Not on file  Social History Narrative   Not on file   Social Determinants of Health   Financial Resource Strain: Not on file  Food Insecurity: Not on file  Transportation Needs: Not on file  Physical Activity: Not on file  Stress: Not on file  Social Connections: Not on file  Intimate Partner Violence: Not on file    FAMILY HISTORY: Family History  Problem Relation Age of Onset   Hypertension Mother    COPD Mother    Congestive Heart Failure Mother    Arthritis Mother    Hypertension Father    Aortic aneurysm Father    Myelodysplastic syndrome  Father    Psoriasis Sister    Eczema Sister    Arthritis Sister    Prostate cancer Maternal Grandfather    Cancer Maternal Uncle    Colon cancer Cousin    Breast cancer Cousin    Cancer Cousin    Cancer Cousin    Cancer Cousin    Cervical cancer Cousin    Cancer Cousin    Cancer Cousin     ALLERGIES:  is allergic to monosodium glutamate.  MEDICATIONS:  Current Outpatient Medications  Medication Sig Dispense Refill   amLODipine (NORVASC) 2.5 MG tablet TK 1 T PO QD     Cholecalciferol (VITAMIN D3) 2000 units TABS Take 1 tablet by mouth daily.     Flaxseed, Linseed, (FLAXSEED OIL PO) Take 1,300 mg by mouth  2 (two) times daily.     Multiple Vitamins-Minerals (ICAPS AREDS FORMULA PO) Take by mouth.     ascorbic acid (VITAMIN C) 500 MG tablet Vitamin C 500 mg tablet  Take by oral route.     atropine 1 % ophthalmic solution atropine 1 % eye drops     COD LIVER OIL PO Take 1 capsule by mouth daily.     HONEY PO Take 1 each by mouth as needed.     loratadine (CLARITIN) 5 MG chewable tablet      Multiple Vitamins-Minerals (WOMENS MULTIVITAMIN PO) Take 1 tablet by mouth daily.     No current facility-administered medications for this visit.    REVIEW OF SYSTEMS:   Constitutional: Denies fevers, chills or abnormal night sweats Breast:  Denies any palpable lumps or discharge All other systems were reviewed with the patient and are negative.  PHYSICAL EXAMINATION: ECOG PERFORMANCE STATUS: 1 - Symptomatic but completely ambulatory  Vitals:   02/17/23 1310  BP: (!) 157/88  Pulse: 83  Resp: 19  Temp: (!) 97.2 F (36.2 C)  SpO2: 100%   Filed Weights   02/17/23 1310  Weight: 153 lb 12.8 oz (69.8 kg)    GENERAL:alert, no distress and comfortable    LABORATORY DATA:  I have reviewed the data as listed Lab Results  Component Value Date   WBC 6.8 02/17/2023   HGB 13.8 02/17/2023   HCT 40.6 02/17/2023   MCV 91.2 02/17/2023   PLT 326 02/17/2023   Lab Results  Component Value Date   NA 139 02/17/2023   K 3.8 02/17/2023   CL 106 02/17/2023   CO2 28 02/17/2023    RADIOGRAPHIC STUDIES: I have personally reviewed the radiological reports and agreed with the findings in the report.  ASSESSMENT AND PLAN:  Ductal carcinoma in situ (DCIS) of right breast 02/08/2023:Screening detected right breast calcifications spanning 9.5 cm.  2 biopsies performed anterior and posterior reveal high-grade DCIS with foci suspicious for microinvasion ER 0%, PR 0%, axilla negative  Pathology review: I discussed with the patient the difference between DCIS and invasive breast cancer. It is considered a  precancerous lesion. DCIS is classified as a 0. It is generally detected through mammograms as calcifications. We discussed the significance of grades and its impact on prognosis. We also discussed the importance of ER and PR receptors and their implications to adjuvant treatment options. Prognosis of DCIS dependence on grade, comedo necrosis. It is anticipated that if not treated, 20-30% of DCIS can develop into invasive breast cancer.  Recommendation: Right mastectomy: No role of adjuvant radiation or antiestrogen therapy.  This will be reassessed after reviewing the final path  Return to clinic after  surgery to discuss the final pathology report  All questions were answered. The patient knows to call the clinic with any problems, questions or concerns.    Tamsen Meek, MD 02/18/23

## 2023-02-18 NOTE — Progress Notes (Signed)
REFERRING PROVIDER: Serena Croissant, MD [address] [place],  [place] [address]  PRIMARY PROVIDER:  Tally Joe, MD  PRIMARY REASON FOR VISIT:  1. Ductal carcinoma in situ (DCIS) of right breast   2. Family history of breast cancer   3. Family history of prostate cancer   4. Family history of colon cancer     HISTORY OF PRESENT ILLNESS:   Stephanie Roth, a 67 y.o. female, was seen for a [facility] cancer genetics consultation at the request of Dr. Pamelia Hoit due to a personal history of breast cnacer.  Stephanie Roth presents to clinic today to discuss the possibility of a hereditary predisposition to cancer, to discuss genetic testing, and to further clarify her future cancer risks, as well as potential cancer risks for family members.   In August 2024, at the age of 54, Stephanie Roth was diagnosed with ductal carcinoma in situ of the right breast (ER-/PR-). The treatment plan is pending.    CANCER HISTORY:  Oncology History  Ductal carcinoma in situ (DCIS) of right breast  02/08/2023 Initial Diagnosis   Screening detected right breast calcifications spanning 9.5 cm.  2 biopsies performed anterior and posterior reveal high-grade DCIS with foci suspicious for microinvasion ER 0%, PR 0%, axilla negative   02/17/2023 Cancer Staging   Staging form: Breast, AJCC 8th Edition - Clinical stage from 02/17/2023: Stage 0 (cTis (DCIS), cN0, cM0, PR-, HER2: Not Assessed) - Signed by Serena Croissant, MD on 02/18/2023 Stage prefix: Initial diagnosis Nuclear grade: G3 Laterality: Right Staged by: Pathologist and managing physician Stage used in treatment planning: Yes National guidelines used in treatment planning: Yes Type of national guideline used in treatment planning: NCCN       Past Medical History:  Diagnosis Date   Diabetes mellitus    DUB (dysfunctional uterine bleeding)    Fibroids    uterine   Goiter 06/16/2007   Hypertension    Hyperthyroidism    Iron deficiency anemia    Joint  pain    Menopause     Past Surgical History:  Procedure Laterality Date   blepheroplasty Bilateral    BREAST BIOPSY Right 02/08/2023   MM RT BREAST BX W LOC DEV 1ST LESION IMAGE BX SPEC STEREO GUIDE 02/08/2023 [facility]   BREAST BIOPSY Right 02/08/2023   MM RT BREAST BX W LOC DEV EA AD LESION IMG BX SPEC STEREO GUIDE 02/08/2023 [facility]   CESAREAN SECTION     ECTOPIC PREGNANCY SURGERY     GUM SURGERY     TUBAL LIGATION      FAMILY HISTORY:  We obtained a detailed, 4-generation family history.  Significant diagnoses are listed below: Family History  Problem Relation Age of Onset   Myelodysplastic syndrome Father 49   Cancer Maternal Uncle 36       unknown type   Prostate cancer Maternal Grandfather        mets   Colon cancer Cousin 77       paternal female cousin   Breast cancer Cousin 53       paternal female cousin   Cancer Cousin 40       unk type; pat female cousin   Cervical cancer Cousin 50       mat cousin   Cancer Cousin        x5 pat cousins; unknown type; 4 females--one dx 46; one female dx unknown age    Stephanie Roth is unaware of previous family history of genetic testing  for hereditary cancer risks.  There is no reported Ashkenazi Jewish ancestry. There is no known consanguinity.  GENETIC COUNSELING ASSESSMENT: Stephanie Roth is a 67 y.o. female with a personal and family history which is somewhat suggestive of a hereditary cancer syndrome and predisposition to cancer given the presence of related cancers in the family. We, therefore, discussed and recommended the following at today's visit.   DISCUSSION: We discussed that 5 - 10% of cancer is hereditary.  Most cases of hereditary breast cancer are associated with mutations in BRCA1/2.  There are other genes that can be associated with hereditary breast cancer syndromes.  We discussed that testing is beneficial for several reasons including knowing how to follow individuals for their cancer risks and  understanding if other family members could be at risk for cancer and allowing them to undergo genetic testing.   We reviewed the characteristics, features and inheritance patterns of hereditary cancer syndromes. We also discussed genetic testing, including the appropriate family members to test, the process of testing, insurance coverage and turn-around-time for results. We discussed the implications of a negative, positive, carrier and/or variant of uncertain significant result. We recommended Stephanie Roth pursue genetic testing for a panel that includes genes associated with breast cancer, prostate cancer, colon cancer, and other cancers given multiple relatives with unknown cancers.   The CancerNext-Expanded gene panel offered by [facility] and includes sequencing, rearrangement, and RNA analysis for the following 71 genes:  AIP, ALK, APC, ATM, BAP1, BARD1, BMPR1A, BRCA1, BRCA2, BRIP1, CDC73, CDH1, CDK4, CDKN1B, CDKN2A, CHEK2, DICER1, FH, FLCN, KIF1B, LZTR1,MAX, MEN1, MET, MLH1, MSH2, MSH6, MUTYH, NF1, NF2, NTHL1, PALB2, PHOX2B, PMS2, POT1, PRKAR1A, PTCH1, PTEN, RAD51C,RAD51D, RB1, RET, SDHA, SDHAF2, SDHB, SDHC, SDHD, SMAD4, SMARCA4, SMARCB1, SMARCE1, STK11, SUFU, TMEM127, TP53,TSC1, TSC2 and VHL (sequencing and deletion/duplication); AXIN2, CTNNA1, EGFR, EGLN1, HOXB13, KIT, MITF, MSH3, PDGFRA, POLD1 and POLE (sequencing only); EPCAM and GREM1 (deletion/duplication only).   Based on Stephanie Roth's personal history of breast cancer and family history of a maternal grandfather with metastatic prostate cancer, she meets medical criteria for genetic testing. Despite that she meets criteria, she may still have an out of pocket cost. We discussed that if her out of pocket cost for testing is over $100, the laboratory should contact her and discuss the self-pay prices and/or patient pay assistance programs.    PLAN: After considering the risks, benefits, and limitations, Stephanie Roth provided informed consent to  pursue genetic testing and the blood sample was sent to [facility] for analysis of the CancerNext-Expanded +RNAinisght Panel. Results should be available within approximately 3 weeks' time, at which point they will be disclosed by telephone to Stephanie Roth, as will any additional recommendations warranted by these results. Stephanie Roth will receive a summary of her genetic counseling visit and a copy of her results once available. This information will also be available in Epic.    Stephanie Roth questions were answered to her satisfaction today. Our contact information was provided should additional questions or concerns arise. Thank you for the referral and allowing [place] to share in the care of your patient.   Johngabriel Verde M. Rennie Plowman, MS, [facility] Genetic Counselor [email or web address] (P) [phone]  The patient was seen for a total of 16 minutes in face-to-face genetic counseling.  The patient was accompanied by her sister. Dr. Pamelia Hoit was available to discuss this case as needed.   ______________________________________________________________________ For Office Staff:  Number of people involved in session: 2 Was an Intern/ student involved with case: no

## 2023-02-18 NOTE — Assessment & Plan Note (Signed)
02/08/2023:Screening detected right breast calcifications spanning 9.5 cm.  2 biopsies performed anterior and posterior reveal high-grade DCIS with foci suspicious for microinvasion ER 0%, PR 0%, axilla negative  Pathology review: I discussed with the patient the difference between DCIS and invasive breast cancer. It is considered a precancerous lesion. DCIS is classified as a 0. It is generally detected through mammograms as calcifications. We discussed the significance of grades and its impact on prognosis. We also discussed the importance of ER and PR receptors and their implications to adjuvant treatment options. Prognosis of DCIS dependence on grade, comedo necrosis. It is anticipated that if not treated, 20-30% of DCIS can develop into invasive breast cancer.  Recommendation: Right mastectomy: No role of adjuvant radiation or antiestrogen therapy.  This will be reassessed after reviewing the final path  Return to clinic after surgery to discuss the final pathology report

## 2023-02-19 ENCOUNTER — Encounter: Payer: Self-pay | Admitting: Plastic Surgery

## 2023-02-19 ENCOUNTER — Ambulatory Visit: Payer: Medicare Other | Admitting: Plastic Surgery

## 2023-02-19 VITALS — BP 128/82 | HR 80 | Ht 59.0 in | Wt 154.0 lb

## 2023-02-19 DIAGNOSIS — D0511 Intraductal carcinoma in situ of right breast: Secondary | ICD-10-CM

## 2023-02-19 DIAGNOSIS — Z171 Estrogen receptor negative status [ER-]: Secondary | ICD-10-CM | POA: Diagnosis not present

## 2023-02-19 NOTE — Progress Notes (Signed)
Patient ID: Stephanie Roth, female    DOB: 1956/02/07, 67 y.o.   MRN: [id number]   Chief Complaint  Patient presents with   consult   Breast Cancer    The patient is a 67 year old female here for a consult for breast reconstruction. She has a new right breast cancer that is DCIS.  Patient is here with her sister.  It is estrogen and progesterone negative.  There is some question about microinvasion.  The area spans about 9.5 cm. She is not a smoker and not on a blood thinner.  She does not have diabetes.   She is 4 feet 11 inches tall and weighs 154 pounds.  She is preop bra size 36G.  She is not aware of any family history of breast cancer. She is planning on having a right sided mastectomy.  Her past medical history is positive for hypertension, hypothyroidism, iron deficiency anemia and a goiter. Her past surgical history is positive for breast biopsy, blepharoplasty, C-section, tubal ligation and removal of an ectopic pregnancy.    Review of Systems  Constitutional: Negative.   HENT: Negative.    Eyes: Negative.   Respiratory: Negative.  Negative for chest tightness.   Cardiovascular: Negative.   Gastrointestinal: Negative.   Endocrine: Negative.   Genitourinary: Negative.   Musculoskeletal: Negative.     Past Medical History:  Diagnosis Date   DUB (dysfunctional uterine bleeding)    Fibroids    uterine   Goiter 06/16/2007   Hypertension    Hyperthyroidism    Iron deficiency anemia    Joint pain    Menopause     Past Surgical History:  Procedure Laterality Date   blepheroplasty Bilateral    BREAST BIOPSY Right 02/08/2023   MM RT BREAST BX W LOC DEV 1ST LESION IMAGE BX SPEC STEREO GUIDE 02/08/2023 [facility]   BREAST BIOPSY Right 02/08/2023   MM RT BREAST BX W LOC DEV EA AD LESION IMG BX SPEC STEREO GUIDE 02/08/2023 [facility]   CESAREAN SECTION     ECTOPIC PREGNANCY SURGERY     GUM SURGERY     TUBAL LIGATION        Current Outpatient  Medications:    Multiple Vitamins-Minerals (ICAPS AREDS 2 PO), Take by mouth., Disp: , Rfl:    amLODipine (NORVASC) 2.5 MG tablet, TK 1 T PO QD, Disp: , Rfl:    ascorbic acid (VITAMIN C) 500 MG tablet, Vitamin C 500 mg tablet  Take by oral route., Disp: , Rfl:    atropine 1 % ophthalmic solution, atropine 1 % eye drops, Disp: , Rfl:    Cholecalciferol (VITAMIN D3) 2000 units TABS, Take 1 tablet by mouth daily., Disp: , Rfl:    COD LIVER OIL PO, Take 1 capsule by mouth daily., Disp: , Rfl:    Flaxseed, Linseed, (FLAXSEED OIL PO), Take 1,300 mg by mouth 2 (two) times daily., Disp: , Rfl:    HONEY PO, Take 1 each by mouth as needed., Disp: , Rfl:    loratadine (CLARITIN) 5 MG chewable tablet, , Disp: , Rfl:    Multiple Vitamins-Minerals (ICAPS AREDS FORMULA PO), Take by mouth., Disp: , Rfl:    Multiple Vitamins-Minerals (WOMENS MULTIVITAMIN PO), Take 1 tablet by mouth daily., Disp: , Rfl:    Objective:   Vitals:   02/19/23 1048  BP: 128/82  Pulse: 80  SpO2: 97%    Physical Exam Vitals and nursing note reviewed.  Constitutional:  Appearance: Normal appearance.  HENT:     Head: Atraumatic.  Cardiovascular:     Rate and Rhythm: Normal rate.     Pulses: Normal pulses.  Pulmonary:     Effort: Pulmonary effort is normal.  Skin:    General: Skin is warm.     Capillary Refill: Capillary refill takes less than 2 seconds.  Neurological:     Mental Status: She is alert and oriented to person, place, and time.  Psychiatric:        Mood and Affect: Mood normal.        Behavior: Behavior normal.        Thought Content: Thought content normal.        Judgment: Judgment normal.     Assessment & Plan:  Ductal carcinoma in situ (DCIS) of right breast  The options for reconstruction we explained to the patient / family for breast reconstruction.  There are two general categories of reconstruction.  We can reconstruction a breast with implants or use the patient's own tissue.  These  were further discussed as listed.  Breast reconstruction is an optional procedure and eligibility depends on the full spectrum of the health of the patient and any co-morbidities.  More than one surgery is often needed to complete the reconstruction process.  The process can take three to twelve months to complete.  The breasts will not be identical due to many factors such as rib differences, shoulder asymmetry and treatments such as radiation.  The goal is to get the breasts to look normal and symmetrical in clothes.  Scars are a part of surgery and may fade some in time but will always be present under clothes.  Surgery may be an option on the non-cancer breast to achieve more symmetry.  No matter which procedure is chosen there is always the risk of complications and even failure of the body to heal.  This could result in no breast.    The options for reconstruction include:  1. Placement of a tissue expander with Acellular dermal matrix. When the expander is the desired size surgery is performed to remove the expander and place an implant.  In some cases the implant can be placed without an expander.  2. Autologous reconstruction can include using a muscle or tissue from another area of the body to create a breast.  3. Combined procedures (ie. latissismus dorsi flap) can be done with an expander / implant placed under the muscle.   The risks, benefits, scars and recovery time were discussed for each of the above. Risks include bleeding, infection, hematoma, seroma, scarring, pain, wound healing complications, flap loss, fat necrosis, capsular contracture, need for implant removal, donor site complications, bulge, hernia, umbilical necrosis, need for urgent reoperation, and need for dressing changes.   The procedure the patient selected / that was best for the patient, was then discussed in further detail.  Total time: 45 minutes. This includes time spent with the patient during the visit as well as  time spent before and after the visit reviewing the chart, documenting the encounter, making phone calls and reviewing studies.   I do not think that autologous reconstruction is going to be a good option for the patient and she agrees.  We have it narrowed down to an expander on the right to implant with left breast reduction.  The other option is a Goldilocks closure.  She seemed like maybe she was leaning towards that with a left breast reduction.  She  is going to think about it over the weekend and give [place] a call.  Otherwise we have set up a office visit so that she she is not lost and I have spoken with Dr. Carolynne Edouard and let him know what the patient is thinking.  Pictures were obtained of the patient and placed in the chart with the patient's or guardian's permission.   Alena Bills Haidynn Almendarez, DO

## 2023-02-23 ENCOUNTER — Encounter: Payer: Self-pay | Admitting: Plastic Surgery

## 2023-02-23 ENCOUNTER — Ambulatory Visit (INDEPENDENT_AMBULATORY_CARE_PROVIDER_SITE_OTHER): Payer: Medicare Other | Admitting: Plastic Surgery

## 2023-02-23 DIAGNOSIS — D0511 Intraductal carcinoma in situ of right breast: Secondary | ICD-10-CM

## 2023-02-23 NOTE — Progress Notes (Signed)
   Subjective:    Patient ID: Stephanie Roth, female    DOB: Jan 15, 1956, 67 y.o.   MRN: [id number]  The patient is a 67 year old female joining me by phone for further discussion about breast reconstruction.  She has a right breast cancer that was recently diagnosed with DCIS.  It is estrogen and progesterone negative.  The area spans a 9.5 cm area.  She does not have diabetes and is not a smoker.  She is 4 feet 11 inches tall and weighs 154 pounds.  Her preoperative bra is a 36G.  She has hypertension, hypothyroidism, iron deficiency anemia and Gorder.  Her past surgical history includes a blepharoplasty, breast biopsy, C-section, tubal ligation and an ectopic pregnancy.  We had talked about the Goldilocks closure and she was thinking about it. She is planning on having the surgery with Dr. Carolynne Edouard.      Review of Systems  Constitutional: Negative.   Eyes: Negative.   Respiratory: Negative.    Cardiovascular: Negative.   Endocrine: Negative.   Genitourinary: Negative.        Objective:   Physical Exam      Assessment & Plan:     ICD-10-CM   1. Ductal carcinoma in situ (DCIS) of right breast  D05.11       I connected with  Stephanie Roth on 02/23/23 by phone and her sister and her son and verified that I am speaking with the correct person using two identifiers.   I discussed the limitations of evaluation and management by telemedicine. The patient expressed understanding and agreed to proceed.  The combined agreement especially with the patient is to move forward with a flap closure on the cancer side and a breast reduction on the opposite side it will be a right sided Goldilocks closure with a left breast reduction.  We spent 10 minutes in discussion the patient was at home and I was at the office.

## 2023-02-24 ENCOUNTER — Ambulatory Visit: Payer: Self-pay | Admitting: General Surgery

## 2023-02-24 DIAGNOSIS — D0511 Intraductal carcinoma in situ of right breast: Secondary | ICD-10-CM

## 2023-02-24 MED ORDER — KETOROLAC TROMETHAMINE 15 MG/ML IJ SOLN: 15.0000 mg | INTRAMUSCULAR | Status: AC

## 2023-02-25 ENCOUNTER — Encounter: Payer: Self-pay | Admitting: *Deleted

## 2023-02-25 ENCOUNTER — Telehealth: Payer: Self-pay | Admitting: *Deleted

## 2023-02-25 NOTE — Telephone Encounter (Signed)
Spoke with patient to follow up from [facility] 9/4 and assess navigation needs. Patient denies any questions or concerns at this time. Encouraged her to call should anything arise. Patient verbalized understanding.

## 2023-03-01 ENCOUNTER — Encounter: Payer: Self-pay | Admitting: *Deleted

## 2023-03-02 ENCOUNTER — Encounter: Payer: Self-pay | Admitting: Genetic Counselor

## 2023-03-02 ENCOUNTER — Ambulatory Visit: Payer: Self-pay | Admitting: Genetic Counselor

## 2023-03-02 ENCOUNTER — Telehealth: Payer: Self-pay | Admitting: Genetic Counselor

## 2023-03-02 ENCOUNTER — Telehealth: Payer: Self-pay | Admitting: *Deleted

## 2023-03-02 DIAGNOSIS — Z8042 Family history of malignant neoplasm of prostate: Secondary | ICD-10-CM

## 2023-03-02 DIAGNOSIS — Z1379 Encounter for other screening for genetic and chromosomal anomalies: Secondary | ICD-10-CM

## 2023-03-02 DIAGNOSIS — D0511 Intraductal carcinoma in situ of right breast: Secondary | ICD-10-CM

## 2023-03-02 DIAGNOSIS — Z803 Family history of malignant neoplasm of breast: Secondary | ICD-10-CM

## 2023-03-02 DIAGNOSIS — Z800 Family history of malignant neoplasm of digestive organs: Secondary | ICD-10-CM

## 2023-03-02 NOTE — Telephone Encounter (Signed)
Disclosed negative genetics.    

## 2023-03-02 NOTE — Telephone Encounter (Signed)
Pt called with concerns that her sx date is scheduled at the end of Oct (10/31) Discussed that since she has non invasive dz, we are less concerned about cells traveling outside the duct. Received verbal understanding. Reached out to Dr. Carolynne Edouard and staff regarding an earlier sx date if possible. Denies further questions or needs at this time.

## 2023-03-02 NOTE — Progress Notes (Signed)
HPI:   Stephanie Roth was previously seen in the [facility] due to a personal history of breast cancer and concerns regarding a hereditary predisposition to cancer.    Stephanie Roth recent genetic test results were disclosed to her by telephone. These results and recommendations are discussed in more detail below.  CANCER HISTORY:  Oncology History  Ductal carcinoma in situ (DCIS) of right breast  02/08/2023 Initial Diagnosis   Screening detected right breast calcifications spanning 9.5 cm.  2 biopsies performed anterior and posterior reveal high-grade DCIS with foci suspicious for microinvasion ER 0%, PR 0%, axilla negative   02/17/2023 Cancer Staging   Staging form: Breast, AJCC 8th Edition - Clinical stage from 02/17/2023: Stage 0 (cTis (DCIS), cN0, cM0, PR-, HER2: Not Assessed) - Signed by Serena Croissant, MD on 02/18/2023 Stage prefix: Initial diagnosis Nuclear grade: G3 Laterality: Right Staged by: Pathologist and managing physician Stage used in treatment planning: Yes National guidelines used in treatment planning: Yes Type of national guideline used in treatment planning: NCCN   02/27/2023 Genetic Testing   Negative [facility] CancerNext-Expanded +RNAinsight Panel.  Report date is 02/27/2023.   The CancerNext-Expanded gene panel offered by [facility] and includes sequencing, rearrangement, and RNA analysis for the following 71 genes:  AIP, ALK, APC, ATM, BAP1, BARD1, BMPR1A, BRCA1, BRCA2, BRIP1, CDC73, CDH1, CDK4, CDKN1B, CDKN2A, CHEK2, DICER1, FH, FLCN, KIF1B, LZTR1,MAX, MEN1, MET, MLH1, MSH2, MSH6, MUTYH, NF1, NF2, NTHL1, PALB2, PHOX2B, PMS2, POT1, PRKAR1A, PTCH1, PTEN, RAD51C,RAD51D, RB1, RET, SDHA, SDHAF2, SDHB, SDHC, SDHD, SMAD4, SMARCA4, SMARCB1, SMARCE1, STK11, SUFU, TMEM127, TP53,TSC1, TSC2 and VHL (sequencing and deletion/duplication); AXIN2, CTNNA1, EGFR, EGLN1, HOXB13, KIT, MITF, MSH3, PDGFRA, POLD1 and POLE (sequencing only); EPCAM and GREM1 (deletion/duplication  only).      FAMILY HISTORY:  We obtained a detailed, 4-generation family history.  Significant diagnoses are listed below:      Family History  Problem Relation Age of Onset   Myelodysplastic syndrome Father 31   Cancer Maternal Uncle 72        unknown type   Prostate cancer Maternal Grandfather          mets   Colon cancer Cousin 18        paternal female cousin   Breast cancer Cousin 69        paternal female cousin   Cancer Cousin 28        unk type; pat female cousin   Cervical cancer Cousin 50        mat cousin   Cancer Cousin          x5 pat cousins; unknown type; 4 females--one dx 18; one female dx unknown age     Stephanie Roth is unaware of previous family history of genetic testing for hereditary cancer risks.  There is no reported Ashkenazi Jewish ancestry. There is no known consanguinity.  GENETIC TEST RESULTS:  The [facility] CancerNext-Expanded +RNAinsight Panel found no pathogenic mutations.   The CancerNext-Expanded gene panel offered by [facility] and includes sequencing, rearrangement, and RNA analysis for the following 71 genes:  AIP, ALK, APC, ATM, BAP1, BARD1, BMPR1A, BRCA1, BRCA2, BRIP1, CDC73, CDH1, CDK4, CDKN1B, CDKN2A, CHEK2, DICER1, FH, FLCN, KIF1B, LZTR1,MAX, MEN1, MET, MLH1, MSH2, MSH6, MUTYH, NF1, NF2, NTHL1, PALB2, PHOX2B, PMS2, POT1, PRKAR1A, PTCH1, PTEN, RAD51C,RAD51D, RB1, RET, SDHA, SDHAF2, SDHB, SDHC, SDHD, SMAD4, SMARCA4, SMARCB1, SMARCE1, STK11, SUFU, TMEM127, TP53,TSC1, TSC2 and VHL (sequencing and deletion/duplication); AXIN2, CTNNA1, EGFR, EGLN1, HOXB13, KIT, MITF, MSH3, PDGFRA, POLD1 and POLE (  sequencing only); EPCAM and GREM1 (deletion/duplication only).  .   The test report has been scanned into EPIC and is located under the Molecular Pathology section of the Results Review tab.  A portion of the result report is included below for reference. Genetic testing reported out on February 27, 2023.       Even though a pathogenic variant was not  identified, possible explanations for the cancer in the family may include: There may be no hereditary risk for cancer in the family. The cancers in Stephanie Roth and/or her family may be sporadic/familial or due to other genetic and environmental factors.  Most breast cancer is not hereditary.  There may be a gene mutation in one of these genes that current testing methods cannot detect but that chance is small. There could be another gene that has not yet been discovered, or that we have not yet tested, that is responsible for the cancer diagnoses in the family.  It is also possible there is a hereditary cause for the cancer in the family that Stephanie Roth.  Therefore, it is important to remain in touch with cancer genetics in the future so that we can continue to offer Stephanie Roth the most up to date genetic testing.    ADDITIONAL GENETIC TESTING:   Stephanie Roth genetic testing was fairly extensive.  If there are additional relevant genes identified to increase cancer risk that can be analyzed in the future, we would be happy to discuss and coordinate this testing at that time.     CANCER SCREENING RECOMMENDATIONS:  Stephanie Roth test result is considered negative (normal).  This means that we have not identified a hereditary cause for her personal history of breast cancer at this time.   An individual's cancer risk and medical management are not determined by genetic test results alone. Overall cancer risk assessment incorporates additional factors, including personal medical history, family history, and any available genetic information that may result in a personalized plan for cancer prevention and surveillance. Therefore, it is recommended she continue to follow the cancer management and screening guidelines provided by her oncology and primary healthcare provider.    RECOMMENDATIONS FOR FAMILY MEMBERS:   Since she did not Roth a identifiable mutation in a cancer predisposition  gene included on this panel, her children could not have inherited a known mutation from her in one of these genes. Individuals in this family might be at some increased risk of developing cancer, over the general population risk, due to the family history of cancer.  Individuals in the family should notify their providers of the family history of cancer. We recommend women in this family have a yearly mammogram beginning at age 66, or 26 years younger than the earliest onset of cancer, an annual clinical breast exam, and perform monthly breast self-exams.  Risk models that take into account family history and hormonal history may be helpful in determining appropriate breast cancer screening options for family members.    FOLLOW-UP:  Cancer genetics is a rapidly advancing field and it is possible that new genetic tests will be appropriate for her and/or her family members in the future. We encourage Stephanie Roth to remain in contact with cancer genetics, so we can update her personal and family histories and let her know of advances in cancer genetics that may benefit this family.   Our contact number was provided.  They are welcome to call us at anytime with additional questions or concerns.  Satine Hausner M. Rennie Plowman, MS, [facility] Genetic Counselor [email or web address] (P) [phone]

## 2023-03-06 ENCOUNTER — Telehealth: Payer: Self-pay | Admitting: Hematology and Oncology

## 2023-03-08 ENCOUNTER — Other Ambulatory Visit: Payer: Self-pay | Admitting: Plastic Surgery

## 2023-03-08 DIAGNOSIS — D0511 Intraductal carcinoma in situ of right breast: Secondary | ICD-10-CM

## 2023-03-15 ENCOUNTER — Encounter: Payer: Self-pay | Admitting: *Deleted

## 2023-03-31 ENCOUNTER — Telehealth: Payer: Self-pay | Admitting: Plastic Surgery

## 2023-03-31 ENCOUNTER — Ambulatory Visit
Admission: RE | Admit: 2023-03-31 | Discharge: 2023-03-31 | Disposition: A | Discharge status: Home / Self Care | Payer: Medicare Other | Source: Ambulatory Visit | Attending: Family Medicine | Admitting: Family Medicine

## 2023-03-31 ENCOUNTER — Ambulatory Visit: Payer: Medicare Other | Admitting: Surgical

## 2023-03-31 ENCOUNTER — Encounter: Payer: Self-pay | Admitting: Surgical

## 2023-03-31 ENCOUNTER — Telehealth: Payer: Medicare Other | Admitting: Plastic Surgery

## 2023-03-31 VITALS — BP 137/85 | HR 68 | Ht 59.0 in | Wt 152.7 lb

## 2023-03-31 DIAGNOSIS — D489 Neoplasm of uncertain behavior, unspecified: Secondary | ICD-10-CM

## 2023-03-31 DIAGNOSIS — E2839 Other primary ovarian failure: Secondary | ICD-10-CM

## 2023-03-31 DIAGNOSIS — D0511 Intraductal carcinoma in situ of right breast: Secondary | ICD-10-CM

## 2023-03-31 MED ORDER — ONDANSETRON HCL 4 MG PO TABS: 4.0000 mg | ORAL_TABLET | Freq: Three times a day (TID) | ORAL | 0 refills | Status: DC | PRN

## 2023-03-31 MED ORDER — OXYCODONE HCL 5 MG PO TABS: 5.0000 mg | ORAL_TABLET | Freq: Four times a day (QID) | ORAL | 0 refills | Status: AC | PRN

## 2023-03-31 MED ORDER — CEPHALEXIN 500 MG PO CAPS: 500.0000 mg | ORAL_CAPSULE | Freq: Four times a day (QID) | ORAL | 0 refills | Status: AC

## 2023-03-31 NOTE — Telephone Encounter (Signed)
Please schedule telephone visit with Dr. Ulice Bold this afternoon. I spoke with patient, she has questions about doing expander vs goldilocks closure.

## 2023-03-31 NOTE — Telephone Encounter (Signed)
Pt had pre op this morning, and is asking if she can change her options for sx. Please f/u with patient

## 2023-03-31 NOTE — Progress Notes (Signed)
 Patient ID: Stephanie Roth, female    DOB: Jan 18, 1956, 67 y.o.   MRN: [id number]  Chief Complaint  Patient presents with   Pre-op Exam      ICD-10-CM   1. Ductal carcinoma in situ (DCIS) of right breast  D05.11     2. Neoplasm, uncertain whether benign or malignant  D48.9       History of Present Illness: Stephanie Roth is a 67 y.o.  female  with a history of right breast cancer.  She presents for preoperative evaluation for upcoming procedure, Right breast Goldilocks closure, scheduled for 04/09/2023 with Dr. Lowery.  The patient has not had problems with anesthesia. No history of DVT/PE.  No family history of DVT/PE.  No family or personal history of bleeding or clotting disorders.  Patient is not currently taking any blood thinners.  No history of CVA/MI.   Summary of Previous Visit: Patient with history of right breast cancer, recently diagnosed with DCIS.  ER/PR negative.  The area spans 9.5 cm.  She is not diabetic, not a smoker.  Preoperative bra size is a 36G cup.  History of hypertension, hyperthyroidism, iron deficiency anemia  Job: Patient does not request FMLA for herself, she does report that she has FMLA forms for her son who will be helping her with care after surgery.  PMH Significant for: Hypertension, hyperlipidemia, hypothyroidism, iron deficiency anemia, right breast cancer  Patient reports she has been feeling well lately, no recent changes to her health.  She is accompanied by her son via telephone.  She reports that she would like to be about a C cup after surgery.  She has questions about Goldilocks closure.    Past Medical History: Allergies: Allergies  Allergen Reactions   Monosodium Glutamate Swelling    Facial swelling    Current Medications:  Current Outpatient Medications:    amLODipine (NORVASC) 2.5 MG tablet, TK 1 T PO QD, Disp: , Rfl:    ascorbic acid (VITAMIN C) 500 MG tablet, Vitamin C 500 mg tablet  Take by oral route., Disp: , Rfl:     atropine 1 % ophthalmic solution, atropine 1 % eye drops, Disp: , Rfl:    cephALEXin  (KEFLEX ) 500 MG capsule, Take 1 capsule (500 mg total) by mouth 4 (four) times daily for 3 days., Disp: 12 capsule, Rfl: 0   Cholecalciferol (VITAMIN D3) 2000 units TABS, Take 1 tablet by mouth daily., Disp: , Rfl:    COD LIVER OIL PO, Take 1 capsule by mouth daily., Disp: , Rfl:    Flaxseed, Linseed, (FLAXSEED OIL PO), Take 1,300 mg by mouth 2 (two) times daily., Disp: , Rfl:    HONEY PO, Take 1 each by mouth as needed., Disp: , Rfl:    loratadine (CLARITIN) 5 MG chewable tablet, , Disp: , Rfl:    Multiple Vitamins-Minerals (ICAPS AREDS 2 PO), Take by mouth., Disp: , Rfl:    Multiple Vitamins-Minerals (ICAPS AREDS FORMULA PO), Take by mouth., Disp: , Rfl:    Multiple Vitamins-Minerals (WOMENS MULTIVITAMIN PO), Take 1 tablet by mouth daily., Disp: , Rfl:    ondansetron  (ZOFRAN ) 4 MG tablet, Take 1 tablet (4 mg total) by mouth every 8 (eight) hours as needed for nausea or vomiting., Disp: 20 tablet, Rfl: 0   oxyCODONE  (OXY IR/ROXICODONE ) 5 MG immediate release tablet, Take 1 tablet (5 mg total) by mouth every 6 (six) hours as needed for up to 5 days for severe pain (pain score 7-10)., Disp:  20 tablet, Rfl: 0  Past Medical Problems: Past Medical History:  Diagnosis Date   DUB (dysfunctional uterine bleeding)    Fibroids    uterine   Goiter 06/16/2007   Hypertension    Hyperthyroidism    Iron deficiency anemia    Joint pain    Menopause     Past Surgical History: Past Surgical History:  Procedure Laterality Date   blepheroplasty Bilateral    BREAST BIOPSY Right 02/08/2023   MM RT BREAST BX W LOC DEV 1ST LESION IMAGE BX SPEC STEREO GUIDE 02/08/2023 [facility]   BREAST BIOPSY Right 02/08/2023   MM RT BREAST BX W LOC DEV EA AD LESION IMG BX SPEC STEREO GUIDE 02/08/2023 [facility]   CESAREAN SECTION     ECTOPIC PREGNANCY SURGERY     GUM SURGERY     TUBAL LIGATION      Social  History: Social History   Socioeconomic History   Marital status: Divorced    Spouse name: Not on file   Number of children: Not on file   Years of education: Not on file   Highest education level: Not on file  Occupational History   Not on file  Tobacco Use   Smoking status: Never   Smokeless tobacco: Never  Vaping Use   Vaping status: Never Used  Substance and Sexual Activity   Alcohol use: Never   Drug use: Never   Sexual activity: Not on file  Other Topics Concern   Not on file  Social History Narrative   Not on file   Social Determinants of Health   Financial Resource Strain: Not on file  Food Insecurity: No Food Insecurity (02/18/2023)   Hunger Vital Sign    Worried About Running Out of Food in the Last Year: Never true    Ran Out of Food in the Last Year: Never true  Transportation Needs: No Transportation Needs (02/18/2023)   PRAPARE - Administrator, Civil Service (Medical): No    Lack of Transportation (Non-Medical): No  Physical Activity: Not on file  Stress: Not on file  Social Connections: Not on file  Intimate Partner Violence: Not on file    Family History: Family History  Problem Relation Age of Onset   Hypertension Mother    COPD Mother    Congestive Heart Failure Mother    Arthritis Mother    Hypertension Father    Aortic aneurysm Father    Myelodysplastic syndrome Father 24   Psoriasis Sister    Eczema Sister    Arthritis Sister    Cancer Maternal Grandfather    Prostate cancer Maternal Grandfather        mets   Cancer Maternal Uncle 37       unknown type   Cancer Cousin    Colon cancer Cousin 39       paternal female cousin   Breast cancer Cousin 50       paternal female cousin   Cancer Cousin 51       unk type; pat female cousin   Cervical cancer Cousin 50       mat cousin   Cancer Cousin        x5 pat cousins; unknown type; 4 females--one dx 40; one female dx unknown age    Review of Systems: Review of Systems   Constitutional: Negative.   Respiratory: Negative.    Cardiovascular: Negative.   Gastrointestinal: Negative.   Genitourinary: Negative.   Neurological: Negative.  Physical Exam: Vital Signs BP 137/85 (BP Location: Left Arm, Patient Position: Sitting, Cuff Size: Normal)   Pulse 68   Ht 4\' 11"  (1.499 m)   Wt 152 lb 11.2 oz (69.3 kg)   SpO2 98%   BMI 30.84 kg/m   Physical Exam Constitutional:      General: Not in acute distress.    Appearance: Normal appearance. Not ill-appearing.  HENT:     Head: Normocephalic and atraumatic.  Eyes:     Pupils: Pupils are equal, round Neck:     Musculoskeletal: Normal range of motion.  Cardiovascular:     Rate and Rhythm: Normal rate    Pulses: Normal pulses.  Pulmonary:     Effort: Pulmonary effort is normal. No respiratory distress.  Abdominal:     General: Abdomen is flat. There is no distension.  Musculoskeletal: Normal range of motion.  Skin:    General: Skin is warm and dry.     Findings: No erythema or rash.  Neurological:     General: No focal deficit present.     Mental Status: Alert and oriented to person, place, and time. Mental status is at baseline.     Motor: No weakness.  Psychiatric:        Mood and Affect: Mood normal.        Behavior: Behavior normal.    Assessment/Plan: The patient is scheduled for right breast Goldilocks closure and left breast reduction for with Dr. Lowery.  Risks, benefits, and alternatives of procedure discussed, questions answered and consent obtained.    Smoking Status: Non-smoker; Counseling Given? N/a Last Mammogram: Patient had mammogram 01/20/2023 with subsequent biopsies.  DCIS, high-grade right breast.  No findings suspicious for malignancy in the left breast.  Caprini Score: 7; Risk Factors include: Age, BMI > 25, DCIS of right breast and length of planned surgery. Recommendation for mechanical and possible pharmacological prophylaxis. Encourage early ambulation.   Pictures  obtained: @consult   Post-op Rx sent to pharmacy: Oxycodone , Zofran , Keflex   Patient was provided with the breast reduction and general Surgical Risk consent document and Pain Medication Agreement prior to their appointment.  They had adequate time to read through the risk consent documents and Pain Medication Agreement. We also discussed them in person together during this preop appointment. All of their questions were answered to their satisfaction.  Recommended calling if they have any further questions.  Risk consent form and Pain Medication Agreement to be scanned into patient's chart.  The risk that can be encountered with breast reduction were discussed and include the following but not limited to these:  Breast asymmetry, fluid accumulation, firmness of the breast, inability to breast feed, loss of nipple or areola, skin loss, decrease or no nipple sensation, fat necrosis of the breast tissue, bleeding, infection, healing delay.  There are risks of anesthesia, changes to skin sensation and injury to nerves or blood vessels.  The muscle can be temporarily or permanently injured.  You may have an allergic reaction to tape, suture, glue, blood products which can result in skin discoloration, swelling, pain, skin lesions, poor healing.  Any of these can lead to the need for revisonal surgery or stage procedures.  A reduction has potential to interfere with diagnostic procedures.  Nipple or breast piercing can increase risks of infection.  This procedure is best done when the breast is fully developed.  Changes in the breast will continue to occur over time.  Pregnancy can alter the outcomes of previous breast reduction surgery,  weight gain and weigh loss can also effect the long term appearance.   Discussed with patient to hold supplements 1 week prior to surgery.  Electronically signed by: Donnice PARAS Marten Iles, PA-C 03/31/2023 11:01 AM

## 2023-03-31 NOTE — H&P (View-Only) (Signed)
Patient ID: Stephanie Roth, female    DOB: August 15, 1955, 67 y.o.   MRN: [id number]  Chief Complaint  Patient presents with   Pre-op Exam      ICD-10-CM   1. Ductal carcinoma in situ (DCIS) of right breast  D05.11     2. Neoplasm, uncertain whether benign or malignant  D48.9       History of Present Illness: Stephanie Roth is a 67 y.o.  female  with a history of right breast cancer.  She presents for preoperative evaluation for upcoming procedure, Right breast Goldilocks closure, scheduled for 04/09/2023 with Dr. Ulice Bold.  The patient has not had problems with anesthesia. No history of DVT/PE.  No family history of DVT/PE.  No family or personal history of bleeding or clotting disorders.  Patient is not currently taking any blood thinners.  No history of CVA/MI.   Summary of Previous Visit: Patient with history of right breast cancer, recently diagnosed with DCIS.  ER/PR negative.  The area spans 9.5 cm.  She is not diabetic, not a smoker.  Preoperative bra size is a 36G cup.  History of hypertension, hyperthyroidism, iron deficiency anemia  Job: Patient does not request FMLA for herself, she does report that she has FMLA forms for her son who will be helping her with care after surgery.  PMH Significant for: Hypertension, hyperlipidemia, hypothyroidism, iron deficiency anemia, right breast cancer  Patient reports she has been feeling well lately, no recent changes to her health.  She is accompanied by her son via telephone.  She reports that she would like to be about a C cup after surgery.  She has questions about Goldilocks closure.    Past Medical History: Allergies: Allergies  Allergen Reactions   Monosodium Glutamate Swelling    Facial swelling    Current Medications:  Current Outpatient Medications:    amLODipine (NORVASC) 2.5 MG tablet, TK 1 T PO QD, Disp: , Rfl:    ascorbic acid (VITAMIN C) 500 MG tablet, Vitamin C 500 mg tablet  Take by oral route., Disp: , Rfl:     atropine 1 % ophthalmic solution, atropine 1 % eye drops, Disp: , Rfl:    cephALEXin (KEFLEX) 500 MG capsule, Take 1 capsule (500 mg total) by mouth 4 (four) times daily for 3 days., Disp: 12 capsule, Rfl: 0   Cholecalciferol (VITAMIN D3) 2000 units TABS, Take 1 tablet by mouth daily., Disp: , Rfl:    COD LIVER OIL PO, Take 1 capsule by mouth daily., Disp: , Rfl:    Flaxseed, Linseed, (FLAXSEED OIL PO), Take 1,300 mg by mouth 2 (two) times daily., Disp: , Rfl:    HONEY PO, Take 1 each by mouth as needed., Disp: , Rfl:    loratadine (CLARITIN) 5 MG chewable tablet, , Disp: , Rfl:    Multiple Vitamins-Minerals (ICAPS AREDS 2 PO), Take by mouth., Disp: , Rfl:    Multiple Vitamins-Minerals (ICAPS AREDS FORMULA PO), Take by mouth., Disp: , Rfl:    Multiple Vitamins-Minerals (WOMENS MULTIVITAMIN PO), Take 1 tablet by mouth daily., Disp: , Rfl:    ondansetron (ZOFRAN) 4 MG tablet, Take 1 tablet (4 mg total) by mouth every 8 (eight) hours as needed for nausea or vomiting., Disp: 20 tablet, Rfl: 0   oxyCODONE (OXY IR/ROXICODONE) 5 MG immediate release tablet, Take 1 tablet (5 mg total) by mouth every 6 (six) hours as needed for up to 5 days for severe pain (pain score 7-10)., Disp:  Patient ID: Stephanie Roth, female    DOB: August 15, 1955, 67 y.o.   MRN: [id number]  Chief Complaint  Patient presents with   Pre-op Exam      ICD-10-CM   1. Ductal carcinoma in situ (DCIS) of right breast  D05.11     2. Neoplasm, uncertain whether benign or malignant  D48.9       History of Present Illness: Stephanie Roth is a 67 y.o.  female  with a history of right breast cancer.  She presents for preoperative evaluation for upcoming procedure, Right breast [place] closure, scheduled for 04/09/2023 with Dr. Ulice Bold.  The patient has not had problems with anesthesia. No history of DVT/PE.  No family history of DVT/PE.  No family or personal history of bleeding or clotting disorders.  Patient is not currently taking any blood thinners.  No history of CVA/MI.   Summary of Previous Visit: Patient with history of right breast cancer, recently diagnosed with DCIS.  ER/PR negative.  The area spans 9.5 cm.  She is not diabetic, not a smoker.  Preoperative bra size is a 36G cup.  History of hypertension, hyperthyroidism, iron deficiency anemia  Job: Patient does not request FMLA for herself, she does report that she has FMLA forms for her son who will be helping her with care after surgery.  PMH Significant for: Hypertension, hyperlipidemia, hypothyroidism, iron deficiency anemia, right breast cancer  Patient reports she has been feeling well lately, no recent changes to her health.  She is accompanied by her son via telephone.  She reports that she would like to be about a C cup after surgery.  She has questions about Goldilocks closure.    Past Medical History: Allergies: Allergies  Allergen Reactions   Monosodium Glutamate Swelling    Facial swelling    Current Medications:  Current Outpatient Medications:    amLODipine (NORVASC) 2.5 MG tablet, TK 1 T PO QD, Disp: , Rfl:    ascorbic acid (VITAMIN C) 500 MG tablet, Vitamin C 500 mg tablet  Take by oral route., Disp: , Rfl:     atropine 1 % ophthalmic solution, atropine 1 % eye drops, Disp: , Rfl:    cephALEXin (KEFLEX) 500 MG capsule, Take 1 capsule (500 mg total) by mouth 4 (four) times daily for 3 days., Disp: 12 capsule, Rfl: 0   Cholecalciferol (VITAMIN D3) 2000 units TABS, Take 1 tablet by mouth daily., Disp: , Rfl:    COD LIVER OIL PO, Take 1 capsule by mouth daily., Disp: , Rfl:    Flaxseed, Linseed, (FLAXSEED OIL PO), Take 1,300 mg by mouth 2 (two) times daily., Disp: , Rfl:    HONEY PO, Take 1 each by mouth as needed., Disp: , Rfl:    loratadine (CLARITIN) 5 MG chewable tablet, , Disp: , Rfl:    Multiple Vitamins-Minerals (ICAPS AREDS 2 PO), Take by mouth., Disp: , Rfl:    Multiple Vitamins-Minerals (ICAPS AREDS FORMULA PO), Take by mouth., Disp: , Rfl:    Multiple Vitamins-Minerals (WOMENS MULTIVITAMIN PO), Take 1 tablet by mouth daily., Disp: , Rfl:    ondansetron (ZOFRAN) 4 MG tablet, Take 1 tablet (4 mg total) by mouth every 8 (eight) hours as needed for nausea or vomiting., Disp: 20 tablet, Rfl: 0   oxyCODONE (OXY IR/ROXICODONE) 5 MG immediate release tablet, Take 1 tablet (5 mg total) by mouth every 6 (six) hours as needed for up to 5 days for severe pain (pain score 7-10)., Disp:  Patient ID: Stephanie Roth, female    DOB: August 15, 1955, 67 y.o.   MRN: [id number]  Chief Complaint  Patient presents with   Pre-op Exam      ICD-10-CM   1. Ductal carcinoma in situ (DCIS) of right breast  D05.11     2. Neoplasm, uncertain whether benign or malignant  D48.9       History of Present Illness: Stephanie Roth is a 67 y.o.  female  with a history of right breast cancer.  She presents for preoperative evaluation for upcoming procedure, Right breast [place] closure, scheduled for 04/09/2023 with Dr. Ulice Bold.  The patient has not had problems with anesthesia. No history of DVT/PE.  No family history of DVT/PE.  No family or personal history of bleeding or clotting disorders.  Patient is not currently taking any blood thinners.  No history of CVA/MI.   Summary of Previous Visit: Patient with history of right breast cancer, recently diagnosed with DCIS.  ER/PR negative.  The area spans 9.5 cm.  She is not diabetic, not a smoker.  Preoperative bra size is a 36G cup.  History of hypertension, hyperthyroidism, iron deficiency anemia  Job: Patient does not request FMLA for herself, she does report that she has FMLA forms for her son who will be helping her with care after surgery.  PMH Significant for: Hypertension, hyperlipidemia, hypothyroidism, iron deficiency anemia, right breast cancer  Patient reports she has been feeling well lately, no recent changes to her health.  She is accompanied by her son via telephone.  She reports that she would like to be about a C cup after surgery.  She has questions about Goldilocks closure.    Past Medical History: Allergies: Allergies  Allergen Reactions   Monosodium Glutamate Swelling    Facial swelling    Current Medications:  Current Outpatient Medications:    amLODipine (NORVASC) 2.5 MG tablet, TK 1 T PO QD, Disp: , Rfl:    ascorbic acid (VITAMIN C) 500 MG tablet, Vitamin C 500 mg tablet  Take by oral route., Disp: , Rfl:     atropine 1 % ophthalmic solution, atropine 1 % eye drops, Disp: , Rfl:    cephALEXin (KEFLEX) 500 MG capsule, Take 1 capsule (500 mg total) by mouth 4 (four) times daily for 3 days., Disp: 12 capsule, Rfl: 0   Cholecalciferol (VITAMIN D3) 2000 units TABS, Take 1 tablet by mouth daily., Disp: , Rfl:    COD LIVER OIL PO, Take 1 capsule by mouth daily., Disp: , Rfl:    Flaxseed, Linseed, (FLAXSEED OIL PO), Take 1,300 mg by mouth 2 (two) times daily., Disp: , Rfl:    HONEY PO, Take 1 each by mouth as needed., Disp: , Rfl:    loratadine (CLARITIN) 5 MG chewable tablet, , Disp: , Rfl:    Multiple Vitamins-Minerals (ICAPS AREDS 2 PO), Take by mouth., Disp: , Rfl:    Multiple Vitamins-Minerals (ICAPS AREDS FORMULA PO), Take by mouth., Disp: , Rfl:    Multiple Vitamins-Minerals (WOMENS MULTIVITAMIN PO), Take 1 tablet by mouth daily., Disp: , Rfl:    ondansetron (ZOFRAN) 4 MG tablet, Take 1 tablet (4 mg total) by mouth every 8 (eight) hours as needed for nausea or vomiting., Disp: 20 tablet, Rfl: 0   oxyCODONE (OXY IR/ROXICODONE) 5 MG immediate release tablet, Take 1 tablet (5 mg total) by mouth every 6 (six) hours as needed for up to 5 days for severe pain (pain score 7-10)., Disp:  Patient ID: Stephanie Roth, female    DOB: August 15, 1955, 67 y.o.   MRN: [id number]  Chief Complaint  Patient presents with   Pre-op Exam      ICD-10-CM   1. Ductal carcinoma in situ (DCIS) of right breast  D05.11     2. Neoplasm, uncertain whether benign or malignant  D48.9       History of Present Illness: Stephanie Roth is a 67 y.o.  female  with a history of right breast cancer.  She presents for preoperative evaluation for upcoming procedure, Right breast [place] closure, scheduled for 04/09/2023 with Dr. Ulice Bold.  The patient has not had problems with anesthesia. No history of DVT/PE.  No family history of DVT/PE.  No family or personal history of bleeding or clotting disorders.  Patient is not currently taking any blood thinners.  No history of CVA/MI.   Summary of Previous Visit: Patient with history of right breast cancer, recently diagnosed with DCIS.  ER/PR negative.  The area spans 9.5 cm.  She is not diabetic, not a smoker.  Preoperative bra size is a 36G cup.  History of hypertension, hyperthyroidism, iron deficiency anemia  Job: Patient does not request FMLA for herself, she does report that she has FMLA forms for her son who will be helping her with care after surgery.  PMH Significant for: Hypertension, hyperlipidemia, hypothyroidism, iron deficiency anemia, right breast cancer  Patient reports she has been feeling well lately, no recent changes to her health.  She is accompanied by her son via telephone.  She reports that she would like to be about a C cup after surgery.  She has questions about Goldilocks closure.    Past Medical History: Allergies: Allergies  Allergen Reactions   Monosodium Glutamate Swelling    Facial swelling    Current Medications:  Current Outpatient Medications:    amLODipine (NORVASC) 2.5 MG tablet, TK 1 T PO QD, Disp: , Rfl:    ascorbic acid (VITAMIN C) 500 MG tablet, Vitamin C 500 mg tablet  Take by oral route., Disp: , Rfl:     atropine 1 % ophthalmic solution, atropine 1 % eye drops, Disp: , Rfl:    cephALEXin (KEFLEX) 500 MG capsule, Take 1 capsule (500 mg total) by mouth 4 (four) times daily for 3 days., Disp: 12 capsule, Rfl: 0   Cholecalciferol (VITAMIN D3) 2000 units TABS, Take 1 tablet by mouth daily., Disp: , Rfl:    COD LIVER OIL PO, Take 1 capsule by mouth daily., Disp: , Rfl:    Flaxseed, Linseed, (FLAXSEED OIL PO), Take 1,300 mg by mouth 2 (two) times daily., Disp: , Rfl:    HONEY PO, Take 1 each by mouth as needed., Disp: , Rfl:    loratadine (CLARITIN) 5 MG chewable tablet, , Disp: , Rfl:    Multiple Vitamins-Minerals (ICAPS AREDS 2 PO), Take by mouth., Disp: , Rfl:    Multiple Vitamins-Minerals (ICAPS AREDS FORMULA PO), Take by mouth., Disp: , Rfl:    Multiple Vitamins-Minerals (WOMENS MULTIVITAMIN PO), Take 1 tablet by mouth daily., Disp: , Rfl:    ondansetron (ZOFRAN) 4 MG tablet, Take 1 tablet (4 mg total) by mouth every 8 (eight) hours as needed for nausea or vomiting., Disp: 20 tablet, Rfl: 0   oxyCODONE (OXY IR/ROXICODONE) 5 MG immediate release tablet, Take 1 tablet (5 mg total) by mouth every 6 (six) hours as needed for up to 5 days for severe pain (pain score 7-10)., Disp:

## 2023-04-02 ENCOUNTER — Ambulatory Visit (INDEPENDENT_AMBULATORY_CARE_PROVIDER_SITE_OTHER): Payer: Medicare Other | Admitting: Plastic Surgery

## 2023-04-02 ENCOUNTER — Encounter (HOSPITAL_BASED_OUTPATIENT_CLINIC_OR_DEPARTMENT_OTHER): Payer: Self-pay | Admitting: General Surgery

## 2023-04-02 ENCOUNTER — Other Ambulatory Visit: Payer: Self-pay

## 2023-04-02 ENCOUNTER — Encounter: Payer: Self-pay | Admitting: Plastic Surgery

## 2023-04-02 DIAGNOSIS — D0511 Intraductal carcinoma in situ of right breast: Secondary | ICD-10-CM

## 2023-04-02 NOTE — Progress Notes (Signed)
Subjective:    Patient ID: Stephanie Roth, female    DOB: [birth year], 67 y.o.   MRN: [id number]  HPI The patient joined me by phone for this visit.  She has been diagnosed with right breast DCIS and opted for a [place] closure.  She is saying today that she has changed her mind and is thinking about implant based reconstruction.   Review of Systems  Constitutional: Negative.   HENT: Negative.    Eyes: Negative.   Respiratory: Negative.    Cardiovascular: Negative.   Gastrointestinal: Negative.   Endocrine: Negative.   Genitourinary: Negative.        Objective:   Physical Exam        Assessment & Plan:     ICD-10-CM   1. Ductal carcinoma in situ (DCIS) of right breast  D05.11       I connected with  Stephanie Roth on 04/02/23 by phone and verified that I am speaking with the correct person using two identifiers. The patient was at home and I was at the hospital.    We sp[end 5 minutes in discussion .  The patient is still not sure about her plan.   I have asked her to come into the office next Friday for further discussion.  Patient has agreed.   I discussed the limitations of evaluation and management by telemedicine. The patient expressed understanding and agreed to proceed.

## 2023-04-06 ENCOUNTER — Telehealth: Payer: Medicare Other | Admitting: Plastic Surgery

## 2023-04-09 ENCOUNTER — Encounter: Payer: Self-pay | Admitting: Plastic Surgery

## 2023-04-09 ENCOUNTER — Ambulatory Visit (HOSPITAL_COMMUNITY): Payer: Medicare Other

## 2023-04-09 ENCOUNTER — Encounter: Payer: Medicare Other | Admitting: Student

## 2023-04-09 ENCOUNTER — Ambulatory Visit (INDEPENDENT_AMBULATORY_CARE_PROVIDER_SITE_OTHER): Payer: Medicare Other | Admitting: Plastic Surgery

## 2023-04-09 VITALS — BP 152/94 | HR 98 | Ht 59.0 in | Wt 154.0 lb

## 2023-04-09 DIAGNOSIS — D0511 Intraductal carcinoma in situ of right breast: Secondary | ICD-10-CM | POA: Diagnosis not present

## 2023-04-09 DIAGNOSIS — I10 Essential (primary) hypertension: Secondary | ICD-10-CM

## 2023-04-09 NOTE — H&P (View-Only) (Signed)
Patient ID: Stephanie Roth, female    DOB: [birth year], 67 y.o.   MRN: [id number]   Chief Complaint  Patient presents with   Pre-op Exam   Breast Cancer   Breast Problem    The patient is a 67 year old female here for further discussion about breast reconstruction.  She is accompanied by her son in person and 2 children virtually.  She has a right breast cancer that was diagnosed as DCIS.  It is estrogen and progesterone negative with possible microinvasion that spans 9.5 cm.  She does not have diabetes, is not a smoker and is not on any blood thinner.  She is 4 feet 11 inches tall and weighs 154 pounds.  She has extremely large breasts with a preoperative bra size of 36G.  She has decided on a right mastectomy.  Her past medical history is positive for hypertension, a goiter, hypothyroidism and iron deficiency anemia.  She has changed her mind some on her reconstructive options.  Today we are going to go through everything again.  I want a make sure she is very comfortable with her decision.      Review of Systems  Constitutional: Negative.   HENT: Negative.    Eyes: Negative.   Respiratory: Negative.    Gastrointestinal: Negative.   Endocrine: Negative.   Genitourinary: Negative.   Musculoskeletal: Negative.     Past Medical History:  Diagnosis Date   Cancer (HCC) 02/2023   right breast DCIS   DUB (dysfunctional uterine bleeding)    Fibroids    uterine   Goiter 06/16/2007   Hypertension    Hyperthyroidism    Iron deficiency anemia    Joint pain    Menopause     Past Surgical History:  Procedure Laterality Date   blepheroplasty Bilateral    BREAST BIOPSY Right 02/08/2023   MM RT BREAST BX W LOC DEV 1ST LESION IMAGE BX SPEC STEREO GUIDE 02/08/2023 [facility]   BREAST BIOPSY Right 02/08/2023   MM RT BREAST BX W LOC DEV EA AD LESION IMG BX SPEC STEREO GUIDE 02/08/2023 [facility]   CESAREAN SECTION     ECTOPIC PREGNANCY SURGERY     GUM SURGERY      TUBAL LIGATION        Current Outpatient Medications:    amLODipine (NORVASC) 2.5 MG tablet, TK 1 T PO QD, Disp: , Rfl:    Apoaequorin (PREVAGEN PO), Take by mouth., Disp: , Rfl:    Cholecalciferol (VITAMIN D3) 2000 units TABS, Take 1 tablet by mouth daily., Disp: , Rfl:    COD LIVER OIL PO, Take 1 capsule by mouth daily., Disp: , Rfl:    fexofenadine (ALLEGRA) 180 MG tablet, Take 180 mg by mouth daily., Disp: , Rfl:    Flaxseed, Linseed, (FLAXSEED OIL PO), Take 1,300 mg by mouth 2 (two) times daily., Disp: , Rfl:    HONEY PO, Take 1 each by mouth as needed., Disp: , Rfl:    Iron-FA-B Cmp-C-Biot-Probiotic (FUSION PLUS PO), Take by mouth., Disp: , Rfl:    loratadine (CLARITIN) 5 MG chewable tablet, , Disp: , Rfl:    Multiple Vitamins-Minerals (ICAPS AREDS 2 PO), Take by mouth., Disp: , Rfl:    Multiple Vitamins-Minerals (WOMENS MULTIVITAMIN PO), Take 1 tablet by mouth daily., Disp: , Rfl:    niacin (VITAMIN B3) 500 MG ER tablet, Take 500 mg by mouth at bedtime., Disp: , Rfl:    polyethylene glycol powder (GLYCOLAX/MIRALAX) 17 GM/SCOOP powder,  Patient ID: Stephanie Roth, female    DOB: [birth year], 67 y.o.   MRN: [id number]   Chief Complaint  Patient presents with   Pre-op Exam   Breast Cancer   Breast Problem    The patient is a 67 year old female here for further discussion about breast reconstruction.  She is accompanied by her son in person and 2 children virtually.  She has a right breast cancer that was diagnosed as DCIS.  It is estrogen and progesterone negative with possible microinvasion that spans 9.5 cm.  She does not have diabetes, is not a smoker and is not on any blood thinner.  She is 4 feet 11 inches tall and weighs 154 pounds.  She has extremely large breasts with a preoperative bra size of 36G.  She has decided on a right mastectomy.  Her past medical history is positive for hypertension, a goiter, hypothyroidism and iron deficiency anemia.  She has changed her mind some on her reconstructive options.  Today we are going to go through everything again.  I want a make sure she is very comfortable with her decision.      Review of Systems  Constitutional: Negative.   HENT: Negative.    Eyes: Negative.   Respiratory: Negative.    Gastrointestinal: Negative.   Endocrine: Negative.   Genitourinary: Negative.   Musculoskeletal: Negative.     Past Medical History:  Diagnosis Date   Cancer (HCC) 02/2023   right breast DCIS   DUB (dysfunctional uterine bleeding)    Fibroids    uterine   Goiter 06/16/2007   Hypertension    Hyperthyroidism    Iron deficiency anemia    Joint pain    Menopause     Past Surgical History:  Procedure Laterality Date   blepheroplasty Bilateral    BREAST BIOPSY Right 02/08/2023   MM RT BREAST BX W LOC DEV 1ST LESION IMAGE BX SPEC STEREO GUIDE 02/08/2023 [facility]   BREAST BIOPSY Right 02/08/2023   MM RT BREAST BX W LOC DEV EA AD LESION IMG BX SPEC STEREO GUIDE 02/08/2023 [facility]   CESAREAN SECTION     ECTOPIC PREGNANCY SURGERY     GUM SURGERY      TUBAL LIGATION        Current Outpatient Medications:    amLODipine (NORVASC) 2.5 MG tablet, TK 1 T PO QD, Disp: , Rfl:    Apoaequorin (PREVAGEN PO), Take by mouth., Disp: , Rfl:    Cholecalciferol (VITAMIN D3) 2000 units TABS, Take 1 tablet by mouth daily., Disp: , Rfl:    COD LIVER OIL PO, Take 1 capsule by mouth daily., Disp: , Rfl:    fexofenadine (ALLEGRA) 180 MG tablet, Take 180 mg by mouth daily., Disp: , Rfl:    Flaxseed, Linseed, (FLAXSEED OIL PO), Take 1,300 mg by mouth 2 (two) times daily., Disp: , Rfl:    HONEY PO, Take 1 each by mouth as needed., Disp: , Rfl:    Iron-FA-B Cmp-C-Biot-Probiotic (FUSION PLUS PO), Take by mouth., Disp: , Rfl:    loratadine (CLARITIN) 5 MG chewable tablet, , Disp: , Rfl:    Multiple Vitamins-Minerals (ICAPS AREDS 2 PO), Take by mouth., Disp: , Rfl:    Multiple Vitamins-Minerals (WOMENS MULTIVITAMIN PO), Take 1 tablet by mouth daily., Disp: , Rfl:    niacin (VITAMIN B3) 500 MG ER tablet, Take 500 mg by mouth at bedtime., Disp: , Rfl:    polyethylene glycol powder (GLYCOLAX/MIRALAX) 17 GM/SCOOP powder,

## 2023-04-09 NOTE — Progress Notes (Signed)
Patient ID: Stephanie Roth, female    DOB: [birth year], 67 y.o.   MRN: [id number]   Chief Complaint  Patient presents with   Pre-op Exam   Breast Cancer   Breast Problem    The patient is a 67 year old female here for further discussion about breast reconstruction.  She is accompanied by her son in person and 2 children virtually.  She has a right breast cancer that was diagnosed as DCIS.  It is estrogen and progesterone negative with possible microinvasion that spans 9.5 cm.  She does not have diabetes, is not a smoker and is not on any blood thinner.  She is 4 feet 11 inches tall and weighs 154 pounds.  She has extremely large breasts with a preoperative bra size of 36G.  She has decided on a right mastectomy.  Her past medical history is positive for hypertension, a goiter, hypothyroidism and iron deficiency anemia.  She has changed her mind some on her reconstructive options.  Today we are going to go through everything again.  I want a make sure she is very comfortable with her decision.      Review of Systems  Constitutional: Negative.   HENT: Negative.    Eyes: Negative.   Respiratory: Negative.    Gastrointestinal: Negative.   Endocrine: Negative.   Genitourinary: Negative.   Musculoskeletal: Negative.     Past Medical History:  Diagnosis Date   Cancer (HCC) 02/2023   right breast DCIS   DUB (dysfunctional uterine bleeding)    Fibroids    uterine   Goiter 06/16/2007   Hypertension    Hyperthyroidism    Iron deficiency anemia    Joint pain    Menopause     Past Surgical History:  Procedure Laterality Date   blepheroplasty Bilateral    BREAST BIOPSY Right 02/08/2023   MM RT BREAST BX W LOC DEV 1ST LESION IMAGE BX SPEC STEREO GUIDE 02/08/2023 [facility]   BREAST BIOPSY Right 02/08/2023   MM RT BREAST BX W LOC DEV EA AD LESION IMG BX SPEC STEREO GUIDE 02/08/2023 [facility]   CESAREAN SECTION     ECTOPIC PREGNANCY SURGERY     GUM SURGERY      TUBAL LIGATION        Current Outpatient Medications:    amLODipine (NORVASC) 2.5 MG tablet, TK 1 T PO QD, Disp: , Rfl:    Apoaequorin (PREVAGEN PO), Take by mouth., Disp: , Rfl:    Cholecalciferol (VITAMIN D3) 2000 units TABS, Take 1 tablet by mouth daily., Disp: , Rfl:    COD LIVER OIL PO, Take 1 capsule by mouth daily., Disp: , Rfl:    fexofenadine (ALLEGRA) 180 MG tablet, Take 180 mg by mouth daily., Disp: , Rfl:    Flaxseed, Linseed, (FLAXSEED OIL PO), Take 1,300 mg by mouth 2 (two) times daily., Disp: , Rfl:    HONEY PO, Take 1 each by mouth as needed., Disp: , Rfl:    Iron-FA-B Cmp-C-Biot-Probiotic (FUSION PLUS PO), Take by mouth., Disp: , Rfl:    loratadine (CLARITIN) 5 MG chewable tablet, , Disp: , Rfl:    Multiple Vitamins-Minerals (ICAPS AREDS 2 PO), Take by mouth., Disp: , Rfl:    Multiple Vitamins-Minerals (WOMENS MULTIVITAMIN PO), Take 1 tablet by mouth daily., Disp: , Rfl:    niacin (VITAMIN B3) 500 MG ER tablet, Take 500 mg by mouth at bedtime., Disp: , Rfl:    polyethylene glycol powder (GLYCOLAX/MIRALAX) 17 GM/SCOOP powder,  Patient ID: Stephanie Roth, female    DOB: [birth year], 67 y.o.   MRN: [id number]   Chief Complaint  Patient presents with   Pre-op Exam   Breast Cancer   Breast Problem    The patient is a 67 year old female here for further discussion about breast reconstruction.  She is accompanied by her son in person and 2 children virtually.  She has a right breast cancer that was diagnosed as DCIS.  It is estrogen and progesterone negative with possible microinvasion that spans 9.5 cm.  She does not have diabetes, is not a smoker and is not on any blood thinner.  She is 4 feet 11 inches tall and weighs 154 pounds.  She has extremely large breasts with a preoperative bra size of 36G.  She has decided on a right mastectomy.  Her past medical history is positive for hypertension, a goiter, hypothyroidism and iron deficiency anemia.  She has changed her mind some on her reconstructive options.  Today we are going to go through everything again.  I want a make sure she is very comfortable with her decision.      Review of Systems  Constitutional: Negative.   HENT: Negative.    Eyes: Negative.   Respiratory: Negative.    Gastrointestinal: Negative.   Endocrine: Negative.   Genitourinary: Negative.   Musculoskeletal: Negative.     Past Medical History:  Diagnosis Date   Cancer (HCC) 02/2023   right breast DCIS   DUB (dysfunctional uterine bleeding)    Fibroids    uterine   Goiter 06/16/2007   Hypertension    Hyperthyroidism    Iron deficiency anemia    Joint pain    Menopause     Past Surgical History:  Procedure Laterality Date   blepheroplasty Bilateral    BREAST BIOPSY Right 02/08/2023   MM RT BREAST BX W LOC DEV 1ST LESION IMAGE BX SPEC STEREO GUIDE 02/08/2023 [facility]   BREAST BIOPSY Right 02/08/2023   MM RT BREAST BX W LOC DEV EA AD LESION IMG BX SPEC STEREO GUIDE 02/08/2023 [facility]   CESAREAN SECTION     ECTOPIC PREGNANCY SURGERY     GUM SURGERY      TUBAL LIGATION        Current Outpatient Medications:    amLODipine (NORVASC) 2.5 MG tablet, TK 1 T PO QD, Disp: , Rfl:    Apoaequorin (PREVAGEN PO), Take by mouth., Disp: , Rfl:    Cholecalciferol (VITAMIN D3) 2000 units TABS, Take 1 tablet by mouth daily., Disp: , Rfl:    COD LIVER OIL PO, Take 1 capsule by mouth daily., Disp: , Rfl:    fexofenadine (ALLEGRA) 180 MG tablet, Take 180 mg by mouth daily., Disp: , Rfl:    Flaxseed, Linseed, (FLAXSEED OIL PO), Take 1,300 mg by mouth 2 (two) times daily., Disp: , Rfl:    HONEY PO, Take 1 each by mouth as needed., Disp: , Rfl:    Iron-FA-B Cmp-C-Biot-Probiotic (FUSION PLUS PO), Take by mouth., Disp: , Rfl:    loratadine (CLARITIN) 5 MG chewable tablet, , Disp: , Rfl:    Multiple Vitamins-Minerals (ICAPS AREDS 2 PO), Take by mouth., Disp: , Rfl:    Multiple Vitamins-Minerals (WOMENS MULTIVITAMIN PO), Take 1 tablet by mouth daily., Disp: , Rfl:    niacin (VITAMIN B3) 500 MG ER tablet, Take 500 mg by mouth at bedtime., Disp: , Rfl:    polyethylene glycol powder (GLYCOLAX/MIRALAX) 17 GM/SCOOP powder,

## 2023-04-12 DIAGNOSIS — C50911 Malignant neoplasm of unspecified site of right female breast: Secondary | ICD-10-CM | POA: Diagnosis not present

## 2023-04-14 NOTE — Progress Notes (Signed)
Surgical Instructions   Your procedure is scheduled on Monday April 19, 2023. Report to [facility] Main Entrance "A" at 8:00 A.M., then check in with the Admitting office. Any questions or running late day of surgery: call [phone]  Questions prior to your surgery date: call [phone], Monday-Friday, 8am-4pm. If you experience any cold or flu symptoms such as cough, fever, chills, shortness of breath, etc. between now and your scheduled surgery, please notify us at the above number.     Remember:  Do not eat after midnight the night before your surgery   You may drink clear liquids until 7:00 the morning of your surgery.   Clear liquids allowed are: Water, Non-Citrus Juices (without pulp), Carbonated Beverages, Clear Tea, Black Coffee Only (NO MILK, CREAM OR POWDERED CREAMER of any kind), and Gatorade.    Take these medicines the morning of surgery with A SIP OF WATER  amLODipine (NORVASC)  fexofenadine (ALLEGRA)  loratadine (CLARITIN)   One week prior to surgery, STOP taking any Aspirin (unless otherwise instructed by your surgeon) Aleve, Naproxen, Ibuprofen, Motrin, Advil, Goody's, BC's, all herbal medications, fish oil, and non-prescription vitamins.                     Do NOT Smoke (Tobacco/Vaping) for 24 hours prior to your procedure.  If you use a CPAP at night, you may bring your mask/headgear for your overnight stay.   You will be asked to remove any contacts, glasses, piercing's, hearing aid's, dentures/partials prior to surgery. Please bring cases for these items if needed.    Patients discharged the day of surgery will not be allowed to drive home, and someone needs to stay with them for 24 hours.  SURGICAL WAITING ROOM VISITATION Patients may have no more than 2 support people in the waiting area - these visitors may rotate.   Pre-op nurse will coordinate an appropriate time for 1 ADULT support person, who may not rotate, to accompany patient in pre-op.   Children under the age of 78 must have an adult with them who is not the patient and must remain in the main waiting area with an adult.  If the patient needs to stay at the hospital during part of their recovery, the visitor guidelines for inpatient rooms apply.  Please refer to the [facility] website for the visitor guidelines for any additional information.   If you received a COVID test during your pre-op visit  it is requested that you wear a mask when out in public, stay away from anyone that may not be feeling well and notify your surgeon if you develop symptoms. If you have been in contact with anyone that has tested positive in the last 10 days please notify you surgeon.      Pre-operative CHG Bathing Instructions   You can play a key role in reducing the risk of infection after surgery. Your skin needs to be as free of germs as possible. You can reduce the number of germs on your skin by washing with CHG (chlorhexidine gluconate) soap before surgery. CHG is an antiseptic soap that kills germs and continues to kill germs even after washing.   DO NOT use if you have an allergy to chlorhexidine/CHG or antibacterial soaps. If your skin becomes reddened or irritated, stop using the CHG and notify one of our RNs at [phone].              TAKE A SHOWER THE NIGHT BEFORE SURGERY AND  THE DAY OF SURGERY    Please keep in mind the following:  DO NOT shave, including legs and underarms, 48 hours prior to surgery.   You may shave your face before/day of surgery.  Place clean sheets on your bed the night before surgery Use a clean washcloth (not used since being washed) for each shower. DO NOT sleep with pet's night before surgery.  CHG Shower Instructions:  Wash your face and private area with normal soap. If you choose to wash your hair, wash first with your normal shampoo.  After you use shampoo/soap, rinse your hair and body thoroughly to remove shampoo/soap residue.  Turn the  water OFF and apply half the bottle of CHG soap to a CLEAN washcloth.  Apply CHG soap ONLY FROM YOUR NECK DOWN TO YOUR TOES (washing for 3-5 minutes)  DO NOT use CHG soap on face, private areas, open wounds, or sores.  Pay special attention to the area where your surgery is being performed.  If you are having back surgery, having someone wash your back for you may be helpful. Wait 2 minutes after CHG soap is applied, then you may rinse off the CHG soap.  Pat dry with a clean towel  Put on clean pajamas    Additional instructions for the day of surgery: DO NOT APPLY any lotions, deodorants perfumes.   Do not wear jewelry or makeup Do not wear nail polish, gel polish, artificial nails, or any other type of covering on natural nails (fingers and toes) Do not bring valuables to the hospital. [facility] is not responsible for valuables/personal belongings. Put on clean/comfortable clothes.  Please brush your teeth.  Ask your nurse before applying any prescription medications to the skin.

## 2023-04-14 NOTE — Telephone Encounter (Signed)
Is the FMLA she requesting for herself or for her son? Per Matt's pre-operative note, it sounds as though he planned to complete paperwork for her son, but that she herself did not require any. If it's two separate FMLA forms, then I suppose there needs to be two fees. Please clarify with patient, thank you.

## 2023-04-15 ENCOUNTER — Encounter (HOSPITAL_COMMUNITY): Payer: Self-pay

## 2023-04-15 ENCOUNTER — Ambulatory Visit (HOSPITAL_COMMUNITY): Payer: Medicare Other

## 2023-04-15 ENCOUNTER — Other Ambulatory Visit: Payer: Self-pay

## 2023-04-15 ENCOUNTER — Encounter (HOSPITAL_COMMUNITY)
Admission: RE | Admit: 2023-04-15 | Discharge: 2023-04-15 | Disposition: A | Discharge status: Home / Self Care | Payer: Medicare Other | Source: Ambulatory Visit | Attending: General Surgery | Admitting: General Surgery

## 2023-04-15 VITALS — BP 124/70 | HR 76 | Temp 98.3°F | Resp 18 | Ht 59.0 in | Wt 152.7 lb

## 2023-04-15 DIAGNOSIS — Z01818 Encounter for other preprocedural examination: Secondary | ICD-10-CM

## 2023-04-15 DIAGNOSIS — I10 Essential (primary) hypertension: Secondary | ICD-10-CM

## 2023-04-15 LAB — CBC
HCT: 40 % (ref 36.0–46.0)
Hemoglobin: 13 g/dL (ref 12.0–15.0)
MCH: 29.5 pg (ref 26.0–34.0)
MCHC: 32.5 g/dL (ref 30.0–36.0)
MCV: 90.7 fL (ref 80.0–100.0)
Platelets: 416 10*3/uL — ABNORMAL HIGH (ref 150–400)
RBC: 4.41 MIL/uL (ref 3.87–5.11)
RDW: 15.3 % (ref 11.5–15.5)
WBC: 7 10*3/uL (ref 4.0–10.5)
nRBC: 0 % (ref 0.0–0.2)

## 2023-04-15 LAB — BASIC METABOLIC PANEL
Anion gap: 7 (ref 5–15)
BUN: 28 mg/dL — ABNORMAL HIGH (ref 8–23)
CO2: 24 mmol/L (ref 22–32)
Calcium: 9.4 mg/dL (ref 8.9–10.3)
Chloride: 108 mmol/L (ref 98–111)
Creatinine, Ser: 0.89 mg/dL (ref 0.44–1.00)
GFR, Estimated: >60 mL/min (ref >60)
Glucose, Bld: 100 mg/dL — ABNORMAL HIGH (ref 70–99)
Potassium: 3.6 mmol/L (ref 3.5–5.1)
Sodium: 139 mmol/L (ref 135–145)

## 2023-04-15 NOTE — Progress Notes (Signed)
PCP - Wynetta Emery Cardiologist - denies Endocrinologist- Bindubal Balan,MD  PPM/ICD - denies Device Orders -  Rep Notified -   Chest x-ray - na EKG - 04/15/23 Stress Test -denies  ECHO - denies Cardiac Cath - denies  Sleep Study - 2021  CPAP - not recommended per patient  Fasting Blood Sugar - na Checks Blood Sugar _____ times a day  Last dose of GLP1 agonist-  na GLP1 instructions:   Blood Thinner Instructions: Aspirin Instructions:  ERAS Protcol - clearS UNTIL 0700 PRE-SURGERY Ensure or G2- no  COVID TEST- na   Anesthesia review: no  Patient denies shortness of breath, fever, cough and chest pain at PAT appointment   All instructions explained to the patient, with a verbal understanding of the material. Patient agrees to go over the instructions while at home for a better understanding. Patient also instructed to wear a mask when out in public prior to surgery. The opportunity to ask questions was provided.

## 2023-04-16 ENCOUNTER — Telehealth: Payer: Self-pay | Admitting: Surgical

## 2023-04-16 ENCOUNTER — Encounter: Payer: Self-pay | Admitting: *Deleted

## 2023-04-16 NOTE — Telephone Encounter (Signed)
Spoke with patients caregiver, discussed forms and will refax. We discussed what I put in the forms and they had specific questions about question4.

## 2023-04-16 NOTE — Telephone Encounter (Signed)
Patients primary care taker called and says that Susy Frizzle was filling out fmla paperwork for him. He says that some info is missing on the forms, and wants Matt to call him and discuss. His number is [phone]

## 2023-04-19 ENCOUNTER — Ambulatory Visit (HOSPITAL_COMMUNITY): Payer: Medicare Other | Admitting: Anesthesiology

## 2023-04-19 ENCOUNTER — Observation Stay (HOSPITAL_COMMUNITY)
Admission: RE | Admit: 2023-04-19 | Discharge: 2023-04-20 | Disposition: A | Discharge status: Home / Self Care | Payer: Medicare Other | Attending: Plastic Surgery | Admitting: Plastic Surgery

## 2023-04-19 ENCOUNTER — Other Ambulatory Visit: Payer: Self-pay

## 2023-04-19 ENCOUNTER — Encounter (HOSPITAL_COMMUNITY): Payer: Self-pay | Admitting: General Surgery

## 2023-04-19 ENCOUNTER — Encounter (HOSPITAL_COMMUNITY)
Admission: RE | Admit: 2023-04-19 | Discharge: 2023-04-19 | Disposition: A | Discharge status: Home / Self Care | Payer: Medicare Other | Source: Ambulatory Visit | Attending: General Surgery | Admitting: General Surgery

## 2023-04-19 ENCOUNTER — Encounter (HOSPITAL_COMMUNITY)
Admission: RE | Disposition: A | Discharge status: Home / Self Care | Payer: Self-pay | Source: Home / Self Care | Attending: Plastic Surgery

## 2023-04-19 DIAGNOSIS — Z421 Encounter for breast reconstruction following mastectomy: Secondary | ICD-10-CM

## 2023-04-19 DIAGNOSIS — I10 Essential (primary) hypertension: Principal | ICD-10-CM | POA: Insufficient documentation

## 2023-04-19 DIAGNOSIS — C50411 Malignant neoplasm of upper-outer quadrant of right female breast: Secondary | ICD-10-CM

## 2023-04-19 DIAGNOSIS — C50919 Malignant neoplasm of unspecified site of unspecified female breast: Secondary | ICD-10-CM | POA: Diagnosis present

## 2023-04-19 DIAGNOSIS — D0511 Intraductal carcinoma in situ of right breast: Secondary | ICD-10-CM

## 2023-04-19 DIAGNOSIS — G8918 Other acute postprocedural pain: Secondary | ICD-10-CM | POA: Diagnosis not present

## 2023-04-19 DIAGNOSIS — N641 Fat necrosis of breast: Secondary | ICD-10-CM | POA: Diagnosis not present

## 2023-04-19 DIAGNOSIS — Z1231 Encounter for screening mammogram for malignant neoplasm of breast: Secondary | ICD-10-CM | POA: Insufficient documentation

## 2023-04-19 HISTORY — PX: BREAST RECONSTRUCTION WITH PLACEMENT OF TISSUE EXPANDER AND FLEX HD (ACELLULAR HYDRATED DERMIS): SHX6295

## 2023-04-19 HISTORY — PX: MASTECTOMY W/ SENTINEL NODE BIOPSY: SHX2001

## 2023-04-19 LAB — HIV ANTIBODY (ROUTINE TESTING W REFLEX): HIV Screen 4th Generation wRfx: NONREACTIVE

## 2023-04-19 SURGERY — MASTECTOMY WITH SENTINEL LYMPH NODE BIOPSY
Anesthesia: Regional | Laterality: Right

## 2023-04-19 MED ORDER — CEFAZOLIN SODIUM-DEXTROSE 2-4 GM/100ML-% IV SOLN
2.0000 g | Freq: Three times a day (TID) | INTRAVENOUS | Status: DC
  Administered 2023-04-19 – 2023-04-20 (×2): 2 g via INTRAVENOUS
  Filled 2023-04-19 (×2): qty 100

## 2023-04-19 MED ORDER — CEFAZOLIN SODIUM-DEXTROSE 2-4 GM/100ML-% IV SOLN
2.0000 g | INTRAVENOUS | Status: AC
Start: 2023-04-19 — End: 2023-04-19
  Administered 2023-04-19: 2 g via INTRAVENOUS
  Filled 2023-04-19: qty 100

## 2023-04-19 MED ORDER — FENTANYL CITRATE (PF) 100 MCG/2ML IJ SOLN
25.0000 ug | INTRAMUSCULAR | Status: DC | PRN
  Administered 2023-04-19 (×2): 25 ug via INTRAVENOUS

## 2023-04-19 MED ORDER — FENTANYL CITRATE (PF) 100 MCG/2ML IJ SOLN
INTRAMUSCULAR | Status: AC
  Filled 2023-04-19: qty 2

## 2023-04-19 MED ORDER — LIDOCAINE 2% (20 MG/ML) 5 ML SYRINGE
INTRAMUSCULAR | Status: DC | PRN
  Administered 2023-04-19: 60 mg via INTRAVENOUS

## 2023-04-19 MED ORDER — SUGAMMADEX SODIUM 200 MG/2ML IV SOLN
INTRAVENOUS | Status: DC | PRN
  Administered 2023-04-19: 200 mg via INTRAVENOUS

## 2023-04-19 MED ORDER — ROCURONIUM BROMIDE 10 MG/ML (PF) SYRINGE
PREFILLED_SYRINGE | INTRAVENOUS | Status: DC | PRN
  Administered 2023-04-19: 50 mg via INTRAVENOUS

## 2023-04-19 MED ORDER — HYDROMORPHONE HCL 1 MG/ML IJ SOLN
1.0000 mg | INTRAMUSCULAR | Status: DC | PRN
  Administered 2023-04-19: 1 mg via INTRAVENOUS
  Filled 2023-04-19: qty 1

## 2023-04-19 MED ORDER — PHENYLEPHRINE HCL-NACL 20-0.9 MG/250ML-% IV SOLN: INTRAVENOUS | Status: DC | PRN

## 2023-04-19 MED ORDER — FENTANYL CITRATE (PF) 250 MCG/5ML IJ SOLN
INTRAMUSCULAR | Status: AC
  Filled 2023-04-19: qty 5

## 2023-04-19 MED ORDER — MIDAZOLAM HCL 2 MG/2ML IJ SOLN
INTRAMUSCULAR | Status: AC
  Administered 2023-04-19: 1 mg via INTRAVENOUS
  Filled 2023-04-19: qty 2

## 2023-04-19 MED ORDER — DROPERIDOL 2.5 MG/ML IJ SOLN: 0.6250 mg | Freq: Once | INTRAMUSCULAR | Status: DC | PRN

## 2023-04-19 MED ORDER — ACETAMINOPHEN 325 MG PO TABS
325.0000 mg | ORAL_TABLET | Freq: Four times a day (QID) | ORAL | Status: DC
  Administered 2023-04-19 – 2023-04-20 (×3): 325 mg via ORAL
  Filled 2023-04-19 (×3): qty 1

## 2023-04-19 MED ORDER — FENTANYL CITRATE (PF) 100 MCG/2ML IJ SOLN: 50.0000 ug | Freq: Once | INTRAMUSCULAR | Status: AC

## 2023-04-19 MED ORDER — FENTANYL CITRATE (PF) 100 MCG/2ML IJ SOLN
INTRAMUSCULAR | Status: AC
  Administered 2023-04-19: 50 ug via INTRAVENOUS
  Filled 2023-04-19: qty 2

## 2023-04-19 MED ORDER — PHENYLEPHRINE 80 MCG/ML (10ML) SYRINGE FOR IV PUSH (FOR BLOOD PRESSURE SUPPORT)
PREFILLED_SYRINGE | INTRAVENOUS | Status: DC | PRN
  Administered 2023-04-19 (×3): 160 ug via INTRAVENOUS

## 2023-04-19 MED ORDER — DIPHENHYDRAMINE HCL 50 MG/ML IJ SOLN: 12.5000 mg | Freq: Four times a day (QID) | INTRAMUSCULAR | Status: DC | PRN

## 2023-04-19 MED ORDER — ONDANSETRON HCL 4 MG/2ML IJ SOLN
INTRAMUSCULAR | Status: DC | PRN
  Administered 2023-04-19: 4 mg via INTRAVENOUS

## 2023-04-19 MED ORDER — LACTATED RINGERS IV SOLN: INTRAVENOUS | Status: DC | PRN

## 2023-04-19 MED ORDER — OXYCODONE HCL 5 MG PO TABS: 5.0000 mg | ORAL_TABLET | Freq: Once | ORAL | Status: DC | PRN

## 2023-04-19 MED ORDER — CHLORHEXIDINE GLUCONATE CLOTH 2 % EX PADS: 6.0000 | MEDICATED_PAD | Freq: Once | CUTANEOUS | Status: DC

## 2023-04-19 MED ORDER — PROCHLORPERAZINE EDISYLATE 10 MG/2ML IJ SOLN: 5.0000 mg | Freq: Four times a day (QID) | INTRAMUSCULAR | Status: DC | PRN

## 2023-04-19 MED ORDER — POLYETHYLENE GLYCOL 3350 17 G PO PACK: 17.0000 g | PACK | Freq: Every day | ORAL | Status: DC | PRN

## 2023-04-19 MED ORDER — ACETAMINOPHEN 500 MG PO TABS
1000.0000 mg | ORAL_TABLET | ORAL | Status: AC
  Administered 2023-04-19: 1000 mg via ORAL
  Filled 2023-04-19: qty 2

## 2023-04-19 MED ORDER — BUPIVACAINE-EPINEPHRINE (PF) 0.25% -1:200000 IJ SOLN
INTRAMUSCULAR | Status: AC
Start: 2023-04-19 — End: ?
  Filled 2023-04-19: qty 30

## 2023-04-19 MED ORDER — DIPHENHYDRAMINE HCL 12.5 MG/5ML PO ELIX: 12.5000 mg | ORAL_SOLUTION | Freq: Four times a day (QID) | ORAL | Status: DC | PRN

## 2023-04-19 MED ORDER — OXYCODONE HCL 5 MG/5ML PO SOLN: 5.0000 mg | Freq: Once | ORAL | Status: DC | PRN

## 2023-04-19 MED ORDER — METHOCARBAMOL 500 MG PO TABS: 500.0000 mg | ORAL_TABLET | Freq: Three times a day (TID) | ORAL | Status: DC | PRN

## 2023-04-19 MED ORDER — SENNA 8.6 MG PO TABS
1.0000 | ORAL_TABLET | Freq: Two times a day (BID) | ORAL | Status: DC
  Administered 2023-04-19: 8.6 mg via ORAL
  Filled 2023-04-19: qty 1

## 2023-04-19 MED ORDER — BUPIVACAINE HCL (PF) 0.25 % IJ SOLN
INTRAMUSCULAR | Status: DC | PRN
  Administered 2023-04-19: 30 mL via EPIDURAL

## 2023-04-19 MED ORDER — CEFAZOLIN SODIUM-DEXTROSE 2-4 GM/100ML-% IV SOLN: 2.0000 g | INTRAVENOUS | Status: DC

## 2023-04-19 MED ORDER — GABAPENTIN 100 MG PO CAPS
100.0000 mg | ORAL_CAPSULE | ORAL | Status: AC
Start: 2023-04-19 — End: 2023-04-19
  Administered 2023-04-19: 100 mg via ORAL
  Filled 2023-04-19: qty 1

## 2023-04-19 MED ORDER — FENTANYL CITRATE (PF) 250 MCG/5ML IJ SOLN
INTRAMUSCULAR | Status: DC | PRN
  Administered 2023-04-19: 50 ug via INTRAVENOUS
  Administered 2023-04-19: 100 ug via INTRAVENOUS

## 2023-04-19 MED ORDER — PROPOFOL 10 MG/ML IV BOLUS
INTRAVENOUS | Status: DC | PRN
  Administered 2023-04-19: 150 mg via INTRAVENOUS

## 2023-04-19 MED ORDER — DEXAMETHASONE SODIUM PHOSPHATE 10 MG/ML IJ SOLN
INTRAMUSCULAR | Status: AC
  Filled 2023-04-19: qty 1

## 2023-04-19 MED ORDER — LIDOCAINE 2% (20 MG/ML) 5 ML SYRINGE
INTRAMUSCULAR | Status: AC
Start: 2023-04-19 — End: ?
  Filled 2023-04-19: qty 5

## 2023-04-19 MED ORDER — TECHNETIUM TC 99M TILMANOCEPT KIT
1.0000 | PACK | Freq: Once | INTRAVENOUS | Status: AC | PRN
  Administered 2023-04-19: 1 via INTRADERMAL

## 2023-04-19 MED ORDER — 0.9 % SODIUM CHLORIDE (POUR BTL) OPTIME
TOPICAL | Status: DC | PRN
  Administered 2023-04-19: 2000 mL

## 2023-04-19 MED ORDER — DEXAMETHASONE SODIUM PHOSPHATE 10 MG/ML IJ SOLN
INTRAMUSCULAR | Status: DC | PRN
  Administered 2023-04-19: 10 mg via INTRAVENOUS

## 2023-04-19 MED ORDER — PHENYLEPHRINE HCL-NACL 20-0.9 MG/250ML-% IV SOLN
INTRAVENOUS | Status: DC | PRN
  Administered 2023-04-19: 50 ug/min via INTRAVENOUS

## 2023-04-19 MED ORDER — ONDANSETRON HCL 4 MG/2ML IJ SOLN
INTRAMUSCULAR | Status: AC
  Filled 2023-04-19: qty 2

## 2023-04-19 MED ORDER — METHOCARBAMOL 1000 MG/10ML IJ SOLN
500.0000 mg | Freq: Three times a day (TID) | INTRAMUSCULAR | Status: DC | PRN
Start: 2023-04-19 — End: 2023-04-20

## 2023-04-19 MED ORDER — PROPOFOL 10 MG/ML IV BOLUS
INTRAVENOUS | Status: AC
  Filled 2023-04-19: qty 20

## 2023-04-19 MED ORDER — IBUPROFEN 400 MG PO TABS
400.0000 mg | ORAL_TABLET | Freq: Four times a day (QID) | ORAL | Status: DC
  Administered 2023-04-19 – 2023-04-20 (×3): 400 mg via ORAL
  Filled 2023-04-19 (×3): qty 1

## 2023-04-19 MED ORDER — CHLORHEXIDINE GLUCONATE 0.12 % MT SOLN
OROMUCOSAL | Status: AC
  Administered 2023-04-19: 15 mL
  Filled 2023-04-19: qty 15

## 2023-04-19 MED ORDER — ACETAMINOPHEN 10 MG/ML IV SOLN: 1000.0000 mg | Freq: Once | INTRAVENOUS | Status: DC | PRN

## 2023-04-19 MED ORDER — VASHE WOUND IRRIGATION OPTIME
TOPICAL | Status: DC | PRN
  Administered 2023-04-19: 34 [oz_av]

## 2023-04-19 MED ORDER — MIDAZOLAM HCL 2 MG/2ML IJ SOLN: 1.0000 mg | Freq: Once | INTRAMUSCULAR | Status: AC

## 2023-04-19 MED ORDER — PROCHLORPERAZINE MALEATE 10 MG PO TABS: 10.0000 mg | ORAL_TABLET | Freq: Four times a day (QID) | ORAL | Status: DC | PRN

## 2023-04-19 MED ORDER — OXYCODONE HCL 5 MG PO TABS
5.0000 mg | ORAL_TABLET | ORAL | Status: DC | PRN
Start: 2023-04-19 — End: 2023-04-20

## 2023-04-19 MED ORDER — KCL IN DEXTROSE-NACL 20-5-0.45 MEQ/L-%-% IV SOLN
INTRAVENOUS | Status: DC
  Filled 2023-04-19: qty 1000

## 2023-04-19 SURGICAL SUPPLY — 78 items
ADH SKN CLS APL DERMABOND .7 (GAUZE/BANDAGES/DRESSINGS) ×1
APL PRP STRL LF DISP 70% ISPRP (MISCELLANEOUS) ×2
APPLIER CLIP 9.375 MED OPEN (MISCELLANEOUS) ×2
APR CLP MED 9.3 20 MLT OPN (MISCELLANEOUS) ×2
BAG COUNTER SPONGE SURGICOUNT (BAG) ×4 IMPLANT
BAG DECANTER FOR FLEXI CONT (MISCELLANEOUS) ×2 IMPLANT
BAG SPNG CNTER NS LX DISP (BAG) ×2
BINDER BREAST LRG (GAUZE/BANDAGES/DRESSINGS) IMPLANT
BINDER BREAST XLRG (GAUZE/BANDAGES/DRESSINGS) IMPLANT
BIOPATCH RED 1 DISK 7.0 (GAUZE/BANDAGES/DRESSINGS) ×6 IMPLANT
CANISTER SUCT 3000ML PPV (MISCELLANEOUS) ×4 IMPLANT
CHLORAPREP W/TINT 26 (MISCELLANEOUS) ×4 IMPLANT
CLEANSER WND VASHE 34 (WOUND CARE) IMPLANT
CLIP APPLIE 9.375 MED OPEN (MISCELLANEOUS) ×2 IMPLANT
CNTNR URN SCR LID CUP LEK RST (MISCELLANEOUS) ×2 IMPLANT
CONT SPEC 4OZ STRL OR WHT (MISCELLANEOUS) ×1
COVER PROBE W GEL 5X96 (DRAPES) ×2 IMPLANT
COVER SURGICAL LIGHT HANDLE (MISCELLANEOUS) ×4 IMPLANT
DERMABOND ADVANCED .7 DNX12 (GAUZE/BANDAGES/DRESSINGS) ×4 IMPLANT
DEVICE DSSCT PLSMBLD 3.0S LGHT (MISCELLANEOUS) ×2 IMPLANT
DRAIN CHANNEL 15F RND FF W/TCR (WOUND CARE) IMPLANT
DRAPE CHEST BREAST 15X10 FENES (DRAPES) ×2 IMPLANT
DRAPE HALF SHEET 40X57 (DRAPES) ×2 IMPLANT
DRAPE SURG 17X23 STRL (DRAPES) ×8 IMPLANT
DRAPE SURG ORHT 6 SPLT 77X108 (DRAPES) ×4 IMPLANT
DRSG MEPILEX POST OP 4X8 (GAUZE/BANDAGES/DRESSINGS) ×4 IMPLANT
DRSG TEGADERM 4X4.5 CHG (GAUZE/BANDAGES/DRESSINGS) IMPLANT
DRSG TEGADERM 4X4.75 (GAUZE/BANDAGES/DRESSINGS) ×2 IMPLANT
ELECT BLADE 4.0 EZ CLEAN MEGAD (MISCELLANEOUS)
ELECT CAUTERY BLADE 6.4 (BLADE) ×2 IMPLANT
ELECT REM PT RETURN 9FT ADLT (ELECTROSURGICAL) ×2
ELECTRODE BLDE 4.0 EZ CLN MEGD (MISCELLANEOUS) IMPLANT
ELECTRODE REM PT RTRN 9FT ADLT (ELECTROSURGICAL) ×4 IMPLANT
EVACUATOR SILICONE 100CC (DRAIN) ×2 IMPLANT
FILTER IN LINE W/DETACHED HOSE (FILTER) ×2 IMPLANT
GAUZE PAD ABD 8X10 STRL (GAUZE/BANDAGES/DRESSINGS) ×6 IMPLANT
GAUZE SPONGE 4X4 12PLY STRL (GAUZE/BANDAGES/DRESSINGS) ×2 IMPLANT
GLOVE BIO SURGEON STRL SZ 6.5 (GLOVE) ×4 IMPLANT
GLOVE BIO SURGEON STRL SZ7.5 (GLOVE) ×2 IMPLANT
GOWN STRL REUS W/ TWL LRG LVL3 (GOWN DISPOSABLE) ×8 IMPLANT
GOWN STRL REUS W/TWL LRG LVL3 (GOWN DISPOSABLE) ×4
IMPL EXPANDER BREAST 535CC (Breast) IMPLANT
IMPLANT EXPANDER BREAST 535CC (Breast) ×1 IMPLANT
KIT BASIN OR (CUSTOM PROCEDURE TRAY) ×4 IMPLANT
KIT FILL ASEPTIC TRANSFER (MISCELLANEOUS) IMPLANT
KIT TURNOVER KIT B (KITS) ×4 IMPLANT
NDL 18GX1X1/2 (RX/OR ONLY) (NEEDLE) IMPLANT
NDL FILTER BLUNT 18X1 1/2 (NEEDLE) IMPLANT
NDL HYPO 25GX1X1/2 BEV (NEEDLE) IMPLANT
NEEDLE 18GX1X1/2 (RX/OR ONLY) (NEEDLE)
NEEDLE FILTER BLUNT 18X1 1/2 (NEEDLE)
NEEDLE HYPO 25GX1X1/2 BEV (NEEDLE)
NS IRRIG 1000ML POUR BTL (IV SOLUTION) ×4 IMPLANT
PACK GENERAL/GYN (CUSTOM PROCEDURE TRAY) ×4 IMPLANT
PAD ARMBOARD 7.5X6 YLW CONV (MISCELLANEOUS) ×4 IMPLANT
PIN SAFETY STERILE (MISCELLANEOUS) ×2 IMPLANT
PLASMABLADE 3.0S W/LIGHT (MISCELLANEOUS) ×1
SET ASEPTIC TRANSFER (MISCELLANEOUS) IMPLANT
SPECIMEN JAR X LARGE (MISCELLANEOUS) ×2 IMPLANT
STRIP CLOSURE SKIN 1/2X4 (GAUZE/BANDAGES/DRESSINGS) IMPLANT
SUT MNCRL AB 4-0 PS2 18 (SUTURE) ×4 IMPLANT
SUT MON AB 3-0 SH 27 (SUTURE) ×4
SUT MON AB 3-0 SH27 (SUTURE) ×4 IMPLANT
SUT MON AB 5-0 PS2 18 (SUTURE) ×4 IMPLANT
SUT PDS AB 2-0 CT1 27 (SUTURE) ×8 IMPLANT
SUT PDS AB 3-0 SH 27 (SUTURE) IMPLANT
SUT SILK 3 0 SH 30 (SUTURE) IMPLANT
SUT VIC AB 0 CT1 27 (SUTURE)
SUT VIC AB 0 CT1 27XBRD ANBCTR (SUTURE) ×4 IMPLANT
SUT VIC AB 3-0 54X BRD REEL (SUTURE) IMPLANT
SUT VIC AB 3-0 BRD 54 (SUTURE)
SUT VIC AB 3-0 SH 18 (SUTURE) IMPLANT
SUT VIC AB CT1 27XBRD ANBCTRL (SUTURE)
SYR CONTROL 10ML LL (SYRINGE) IMPLANT
TISSUE FLEXHD PERF PLIAB 16X20 (Tissue) IMPLANT
TOWEL GREEN STERILE (TOWEL DISPOSABLE) ×4 IMPLANT
TOWEL GREEN STERILE FF (TOWEL DISPOSABLE) ×4 IMPLANT
TUBE CONNECTING 12X1/4 (SUCTIONS) IMPLANT

## 2023-04-19 NOTE — Progress Notes (Signed)
   04/19/23 1516  TOC Brief Assessment  Insurance and Status Reviewed [facility] Medicare)  Patient has primary care physician Yes Azucena Cecil, Onalee Hua, MD)  Home environment has been reviewed From Home  Prior level of function: Independent  Prior/Current Home Services No current home services  Social Determinants of Health Reivew SDOH reviewed no interventions necessary  Readmission risk has been reviewed Yes (N/A listed)  Transition of care needs no transition of care needs at this time   Patient here for R Breast mastectomy/recon and L breast reduction.  Son is supportive caregiver. TOC will continue to follow patient for any additional discharge needs

## 2023-04-19 NOTE — Anesthesia Procedure Notes (Signed)
Anesthesia Regional Block: Pectoralis block   Pre-Anesthetic Checklist: , timeout performed,  Correct Patient, Correct Site, Correct Laterality,  Correct Procedure, Correct Position, site marked,  Risks and benefits discussed,  Surgical consent,  Pre-op evaluation,  At surgeon's request and post-op pain management  Laterality: Right  Prep: chloraprep       Needles:  Injection technique: Single-shot  Needle Type: Echogenic Stimulator Needle     Needle Length: 9cm  Needle Gauge: 21     Additional Needles:   Procedures:,,,, ultrasound used (permanent image in chart),,    Narrative:  Start time: 04/19/2023 9:30 AM End time: 04/19/2023 9:35 AM Injection made incrementally with aspirations every 5 mL.  Performed by: Personally  Anesthesiologist: Colville Nation, MD  Additional Notes: Discussed risks and benefits of the nerve block in detail, including but not limited vascular injury, permanent nerve damage and infection.   Patient tolerated the procedure well. Local anesthetic introduced in an incremental fashion under minimal resistance after negative aspirations. No paresthesias were elicited. After completion of the procedure, no acute issues were identified and patient continued to be monitored by RN.

## 2023-04-19 NOTE — Anesthesia Preprocedure Evaluation (Signed)
Anesthesia Evaluation  Patient identified by MRN, date of birth, ID band Patient awake    Reviewed: Allergy & Precautions, H&P , NPO status , Patient's Chart, lab work & pertinent test results  Airway Mallampati: II  TM Distance: >3 FB Neck ROM: Full    Dental no notable dental hx.    Pulmonary neg pulmonary ROS   Pulmonary exam normal breath sounds clear to auscultation       Cardiovascular hypertension, Normal cardiovascular exam Rhythm:Regular Rate:Normal     Neuro/Psych negative neurological ROS  negative psych ROS   GI/Hepatic negative GI ROS, Neg liver ROS,,,  Endo/Other   Hyperthyroidism   Renal/GU negative Renal ROS  negative genitourinary   Musculoskeletal negative musculoskeletal ROS (+)    Abdominal   Peds negative pediatric ROS (+)  Hematology  (+) Blood dyscrasia, anemia   Anesthesia Other Findings DCIS of right breast  Reproductive/Obstetrics negative OB ROS                              Anesthesia Physical Anesthesia Plan  ASA: 2  Anesthesia Plan: General and Regional   Post-op Pain Management: Regional block*   Induction: Intravenous  PONV Risk Score and Plan: Ondansetron and Dexamethasone  Airway Management Planned: Oral ETT  Additional Equipment:   Intra-op Plan:   Post-operative Plan: Extubation in OR  Informed Consent: I have reviewed the patients History and Physical, chart, labs and discussed the procedure including the risks, benefits and alternatives for the proposed anesthesia with the patient or authorized representative who has indicated his/her understanding and acceptance.     Dental advisory given  Plan Discussed with: CRNA  Anesthesia Plan Comments:          Anesthesia Quick Evaluation

## 2023-04-19 NOTE — Op Note (Addendum)
Op report    DATE OF OPERATION:  04/19/2023  LOCATION: [facility] Main Operating Room  SURGICAL DIVISION: Plastic Surgery  PREOPERATIVE DIAGNOSES:  1. Right Breast cancer.    POSTOPERATIVE DIAGNOSES:  1. Right Breast cancer.   PROCEDURE:  1. Right immediate breast reconstruction with placement of Acellular Dermal Matrix and a tissue expander.  SURGEON: Foster Simpson, DO  ASSISTANT: Caroline More, PA  ANESTHESIA:  General.   COMPLICATIONS: None.   IMPLANTS: Right - Mentor 535 cc. Ref #[id number], 250 cc of injectable saline placed in the expander. Acellular Dermal Matrix 17 x 20 cm Flex HD  INDICATIONS FOR PROCEDURE:  The patient, Stephanie Roth, is a 67 y.o. female born on 01-14-1956, is here for immediate first stage breast reconstruction with placement of a right tissue expander and Acellular dermal matrix. MRN: [id number]  CONSENT:  Informed consent was obtained directly from the patient. Risks, benefits and alternatives were fully discussed. Specific risks including but not limited to bleeding, infection, hematoma, seroma, scarring, pain, implant infection, implant extrusion, capsular contracture, asymmetry, wound healing problems, and need for further surgery were all discussed. The patient did have an ample opportunity to have her questions answered to her satisfaction.   DESCRIPTION OF PROCEDURE:  The patient was taken to the operating room by the general surgery team. SCDs were placed and IV antibiotics were given. The patient's chest was prepped and draped in a sterile fashion. A time out was performed and the implants to be used were identified. A right mastectomy was performed.  Once the general surgery team had completed their portion of the case the patient was rendered to the plastic and reconstructive surgery team.  Right:  The pocket was irrigated with Vashe solution and hemostasis was achieved with electrocautery.  The ADM was then prepared according to the  manufacture guidelines on the back table to minimize reaction to the preporation process. The ADM was marked over the expander for the edges that would be turned inward.  The 3-0 Monocryl was placed at the lateral aspect of the pectoralis muscle to tack the lateral soft tissue flap to the chest wall and minimize seroma formation potential. The ADM was then brought to the patient and placed over the pectoralis muscle with care for it to remain free from contact with the skin.  The 3-0 PDS was used to suture it in place onto the pectoralis muscle and was done in 4 parts.  The inferior aspect was left open to allow for insertion of the expander. The air was evacuated from the expander and slipped under the ADM.  The tabs were used to secure the expander to the chest wall with the 3-0 PDS.  The inferior aspect of the ADM was then sutured to the inframammary fold with the 3-0 PDS.  One drain was placed at the inframammary fold and the other in the direction of the axilla. They were secured to the skin with 4-0 Silk.  The expander was inflated with injectable saline. The deep layers were closed with 3-0 PDS.  The skin was closed with 3-0 Monocryl then dermabond and steri strips were applied.    The ABDs and breast binder were placed.  The patient tolerated the procedure well and there were no complications.  The patient was allowed to wake from anesthesia and taken to the recovery room in satisfactory condition.   The advanced practice practitioner (APP) assisted throughout the case.  The APP was essential in retraction and counter traction when needed  to make the case progress smoothly.  This retraction and assistance made it possible to see the tissue plans for the procedure.  The assistance was needed for blood control, tissue re-approximation and assisted with closure of the incision site.

## 2023-04-19 NOTE — Progress Notes (Signed)
Pt arrived to 6 north room 17 alert and oriented x4. Pain level 5/10. 2 JP drains on right side. Both in charge position. Bed in lowest position. Call light in reach. Will continue to monitor pt.

## 2023-04-19 NOTE — Interval H&P Note (Signed)
History and Physical Interval Note:  04/19/2023 10:41 AM  Stephanie Roth  has presented today for surgery, with the diagnosis of RIGHT BREAST DUCTAL CARCINOMA IN SITU.  The various methods of treatment have been discussed with the patient and family. After consideration of risks, benefits and other options for treatment, the patient has consented to  Procedure(s) with comments: RIGHT MASTECTOMY AND SENTINEL NODE BIOPSY (Right) - PEC BLOCK IMMEDIATE RIGHT BREAST RECONSTRUCTION WITH PLACEMENT OF TISSUE EXPANDER AND FLEX HD (ACELLULAR HYDRATED DERMIS) (Right) as a surgical intervention.  The patient's history has been reviewed, patient examined, no change in status, stable for surgery.  I have reviewed the patient's chart and labs.  Questions were answered to the patient's satisfaction.     Alena Bills Danella Philson

## 2023-04-19 NOTE — Op Note (Signed)
04/19/2023  11:37 AM  PATIENT:  Stephanie Roth  67 y.o. female  PRE-OPERATIVE DIAGNOSIS:  RIGHT BREAST DUCTAL CARCINOMA IN SITU  POST-OPERATIVE DIAGNOSIS:  RIGHT BREAST DUCTAL CARCINOMA IN SITU  PROCEDURE:  Procedure(s) with comments: RIGHT MASTECTOMY AND SENTINEL NODE BIOPSY (Right) - PEC BLOCK   SURGEON:  Surgeons and Role: Panel 1:    Griselda Miner, MD - Primary  PHYSICIAN ASSISTANT:   ASSISTANTS: none   ANESTHESIA:   general  EBL:  20cc   BLOOD ADMINISTERED:none  DRAINS: none   LOCAL MEDICATIONS USED:  NONE  SPECIMEN:  Source of Specimen:  right mastectomy and sentinel nodes x 2  DISPOSITION OF SPECIMEN:  PATHOLOGY  COUNTS:  YES  TOURNIQUET:  * No tourniquets in log *  DICTATION: .Dragon Dictation  After informed consent was obtained the patient was brought to the operating room and placed in the supine position on the operating table.  After adequate induction of general anesthesia the patient's bilateral chest, breast, and axillary areas were prepped with ChloraPrep, allowed to dry, and draped in usual sterile manner.  An appropriate timeout was performed.  Earlier in the day the patient underwent injection of 1 mCi of technetium sulfur colloid in the subareolar position on the right breast.  The neoprobe was set to technetium and a good signal was identified in the right axilla.  An elliptical incision was then made in the skin around the nipple and areola complex in order to spare as much skin as possible.  The incision was carried through the skin and subcutaneous tissue sharply with the PlasmaBlade.  Breast hooks were used to elevate the skin flaps anteriorly towards the ceiling.  Thin skin flaps were then created by dissecting between the breast tissue and the subcutaneous fat and skin.  This dissection was carried circumferentially all the way to the chest wall.  Next the breast was removed from the pectoralis muscle with the pectoralis fascia.  Once this was  accomplished the entire right breast was removed from the patient.  It was oriented with a stitch on the lateral skin and sent to pathology for further evaluation.  Several small perforating vessels medially were controlled with figure-of-eight 3-0 Vicryl stitches.  The deep right axillary space was entered.  Dissection was carried into the deep right axillary space under the direction of the neoprobe.  I was able to identify 2 lymph nodes with increased signal.  Each of these lymph nodes was excised sharply with the electrocautery and the surrounding small vessels and lymphatics were controlled with clips.  Ex vivo counts on these nodes ranged from 200-700.  No other hot or palpable nodes were identified in the right axilla.  Hemostasis was achieved using the PlasmaBlade.  The wound was irrigated with saline and packed with a moistened lap sponge.  The reconstruction will be performed by Dr. Ulice Bold and her portion will be dictated separately.  At this point the operation the patient was in stable condition.  All needle sponge and instrument counts were correct.  PLAN OF CARE: Admit for overnight observation  PATIENT DISPOSITION:  PACU - hemodynamically stable.   Delay start of Pharmacological VTE agent (>24hrs) due to surgical blood loss or risk of bleeding: no

## 2023-04-19 NOTE — Interval H&P Note (Signed)
History and Physical Interval Note:  04/19/2023 9:23 AM  Stephanie Roth  has presented today for surgery, with the diagnosis of RIGHT BREAST DUCTAL CARCINOMA IN SITU.  The various methods of treatment have been discussed with the patient and family. After consideration of risks, benefits and other options for treatment, the patient has consented to  Procedure(s) with comments: RIGHT MASTECTOMY AND SENTINEL NODE BIOPSY (Right) - PEC BLOCK IMMEDIATE RIGHT BREAST RECONSTRUCTION WITH PLACEMENT OF TISSUE EXPANDER AND FLEX HD (ACELLULAR HYDRATED DERMIS) (Right) as a surgical intervention.  The patient's history has been reviewed, patient examined, no change in status, stable for surgery.  I have reviewed the patient's chart and labs.  Questions were answered to the patient's satisfaction.     [name]

## 2023-04-19 NOTE — Discharge Instructions (Signed)

## 2023-04-19 NOTE — Anesthesia Postprocedure Evaluation (Signed)
Anesthesia Post Note  Patient: Stephanie Roth  Procedure(s) Performed: RIGHT MASTECTOMY AND SENTINEL NODE BIOPSY (Right) IMMEDIATE RIGHT BREAST RECONSTRUCTION WITH PLACEMENT OF TISSUE EXPANDER AND FLEX HD (ACELLULAR HYDRATED DERMIS) (Right)     Patient location during evaluation: PACU Anesthesia Type: General Level of consciousness: awake and alert Pain management: pain level controlled Vital Signs Assessment: post-procedure vital signs reviewed and stable Respiratory status: spontaneous breathing, nonlabored ventilation and respiratory function stable Cardiovascular status: stable and blood pressure returned to baseline Anesthetic complications: no   There were no known notable events for this encounter.  Last Vitals:  Vitals:   04/19/23 1415 04/19/23 1434  BP: 124/68 124/70  Pulse: 66 (!) 57  Resp: 20 16  Temp: 36.7 C (!) 36.4 C  SpO2: 97% 97%    Last Pain:  Vitals:   04/19/23 1434  TempSrc: Oral  PainSc: 5                  Beryle Lathe

## 2023-04-19 NOTE — Plan of Care (Signed)
[facility] Tonsillectomy/Adenoidectomy, Postoperative PEDS care plan entered in error.

## 2023-04-19 NOTE — Transfer of Care (Signed)
Immediate Anesthesia Transfer of Care Note  Patient: Peaches L Wrobleski  Procedure(s) Performed: RIGHT MASTECTOMY AND SENTINEL NODE BIOPSY (Right) IMMEDIATE RIGHT BREAST RECONSTRUCTION WITH PLACEMENT OF TISSUE EXPANDER AND FLEX HD (ACELLULAR HYDRATED DERMIS) (Right)  Patient Location: PACU  Anesthesia Type:General  Level of Consciousness: awake, alert , and oriented  Airway & Oxygen Therapy: Patient Spontanous Breathing and Patient connected to face mask oxygen  Post-op Assessment: Report given to RN, Post -op Vital signs reviewed and stable, and Patient moving all extremities  Post vital signs: Reviewed and stable  Last Vitals:  Vitals Value Taken Time  BP 137/74 04/19/23 1242  Temp 36.8 C 04/19/23 1242  Pulse    Resp 19 04/19/23 1245  SpO2 96 % 04/19/23 1242  Vitals shown include unfiled device data.  Last Pain:  Vitals:   04/19/23 0832  TempSrc:   PainSc: 0-No pain         Complications: There were no known notable events for this encounter.

## 2023-04-19 NOTE — Progress Notes (Signed)
Lunch tray ordered for pt.

## 2023-04-19 NOTE — Progress Notes (Signed)
   04/19/23 1434  Vitals  Temp (!) 97.5 F (36.4 C)  Temp Source Oral  BP 124/70  MAP (mmHg) 87  BP Location Left Arm  BP Method Automatic  Patient Position (if appropriate) Lying  Pulse Rate (!) 57  Pulse Rate Source Monitor  Resp 16  Level of Consciousness  Level of Consciousness Alert  MEWS COLOR  MEWS Score Color Green  Oxygen Therapy  SpO2 97 %  O2 Device Nasal Cannula  O2 Flow Rate (L/min) 2 L/min  Pain Assessment  Pain Scale 0-10  Pain Score 5  MEWS Score  MEWS Temp 0  MEWS Systolic 0  MEWS Pulse 0  MEWS RR 0  MEWS LOC 0  MEWS Score 0

## 2023-04-19 NOTE — H&P (Signed)
REFERRING PHYSICIAN: Sabas Sous, MD PROVIDER: Lindell Noe, MD MRN: [id number] DOB: [birth year] Subjective   Chief Complaint: New Patient (New Breast Cancer )  History of Present Illness: Stephanie Roth is a 67 y.o. female who is seen today as an office consultation for evaluation of New Patient (New Breast Cancer )  We are asked to see the patient in consultation by Dr. Pamelia Hoit to evaluate her for a new right breast cancer. The patient is a 67 year old black female who recently went for a routine screening mammogram. She has not had a mammogram in a couple years. She was found to have 9.5 cm area of calcification in the upper outer quadrant of the right breast. The axilla looked normal. 2 areas of calcification were biopsied and both came back as ductal carcinoma in situ that was ER and PR negative. The second biopsy question whether there could be some microinvasion. She is otherwise in pretty good health and does not smoke. She does have some hypertension  Review of Systems: A complete review of systems was obtained from the patient. I have reviewed this information and discussed as appropriate with the patient. See HPI as well for other ROS.  ROS   Medical History: Past Medical History:  Diagnosis Date  History of cancer  Hypertension  Thyroid disease   Patient Active Problem List  Diagnosis  Ductal carcinoma in situ (DCIS) of right breast   Past Surgical History:  Procedure Laterality Date  BREAST EXCISIONAL BIOPSY  tubal ligation    Allergies  Allergen Reactions  Monosodium Glutamate Swelling  Facial swelling   Current Outpatient Medications on File Prior to Visit  Medication Sig Dispense Refill  amLODIPine (NORVASC) 2.5 MG tablet 1 tablet Orally Once a day for 90 days  fexofenadine (ALLEGRA) 180 MG tablet Take 180 mg by mouth once daily   No current facility-administered medications on file prior to visit.   Family History  Problem Relation Age of Onset   Coronary Artery Disease (Blocked arteries around heart) Mother  Obesity Mother  High blood pressure (Hypertension) Mother  High blood pressure (Hypertension) Father  Hyperlipidemia (Elevated cholesterol) Father  Coronary Artery Disease (Blocked arteries around heart) Father  High blood pressure (Hypertension) Sister    Social History   Tobacco Use  Smoking Status Never  Smokeless Tobacco Never    Social History   Socioeconomic History  Marital status: Single  Tobacco Use  Smoking status: Never  Smokeless tobacco: Never  Substance and Sexual Activity  Alcohol use: Never  Drug use: Never   Objective:  There were no vitals filed for this visit.  There is no height or weight on file to calculate BMI.  Physical Exam Vitals reviewed.  Constitutional:  General: She is not in acute distress. Appearance: Normal appearance.  HENT:  Head: Normocephalic and atraumatic.  Right Ear: External ear normal.  Left Ear: External ear normal.  Nose: Nose normal.  Mouth/Throat:  Mouth: Mucous membranes are moist.  Pharynx: Oropharynx is clear.  Eyes:  General: No scleral icterus. Extraocular Movements: Extraocular movements intact.  Conjunctiva/sclera: Conjunctivae normal.  Pupils: Pupils are equal, round, and reactive to light.  Cardiovascular:  Rate and Rhythm: Normal rate and regular rhythm.  Pulses: Normal pulses.  Heart sounds: Normal heart sounds.  Pulmonary:  Effort: Pulmonary effort is normal. No respiratory distress.  Breath sounds: Normal breath sounds.  Abdominal:  General: Bowel sounds are normal.  Palpations: Abdomen is soft.  Tenderness: There is no abdominal tenderness.  Musculoskeletal:  General: No swelling, tenderness or deformity. Normal range of motion.  Cervical back: Normal range of motion and neck supple.  Skin: General: Skin is warm and dry.  Coloration: Skin is not jaundiced.  Neurological:  General: No focal deficit present.  Mental Status: She  is alert and oriented to person, place, and time.  Psychiatric:  Mood and Affect: Mood normal.  Behavior: Behavior normal.     Breast: There is some firm nodularity in the upper outer aspect of the right breast which could be bruising from the biopsy but is difficult to tell. There is no palpable mass in the left breast. There is no palpable axillary, supraclavicular, or cervical lymphadenopathy.  Labs, Imaging and Diagnostic Testing:  Assessment and Plan:   Diagnoses and all orders for this visit:  Ductal carcinoma in situ (DCIS) of right breast - [facility] Case Posting Request; Future - Ambulatory Referral to Plastic Surgery   The patient appears to have a large area of ductal carcinoma in situ encompassing most of the outer aspect of the right breast. Because of the size of the area involved my recommendation would be for mastectomy. She would also be a good candidate for sentinel node biopsy as well given the question of microinvasion and the size. I have discussed with her in detail the risks and benefits of the operation as well as some of the technical aspects and she understands and wishes to proceed. She would be a good candidate for reconstruction as well and she is interested in this. I will refer her to Dr. Ulice Bold in plastic surgery to talk about reconstructive options and then we can coordinate timing of the surgery. She will also meet with medical and radiation oncology to discuss adjuvant therapy.

## 2023-04-19 NOTE — Anesthesia Procedure Notes (Signed)
Procedure Name: Intubation Date/Time: 04/19/2023 10:08 AM  Performed by: Allyn Kenner, CRNAPre-anesthesia Checklist: Patient identified, Emergency Drugs available, Suction available and Patient being monitored Patient Re-evaluated:Patient Re-evaluated prior to induction Oxygen Delivery Method: Circle System Utilized Preoxygenation: Pre-oxygenation with 100% oxygen Induction Type: IV induction Ventilation: Mask ventilation without difficulty Laryngoscope Size: Miller and 2 Grade View: Grade I Tube type: Oral Tube size: 7.0 mm Number of attempts: 1 Airway Equipment and Method: Stylet and Oral airway Placement Confirmation: ETT inserted through vocal cords under direct vision, positive ETCO2 and breath sounds checked- equal and bilateral Secured at: 24 cm Tube secured with: Tape Dental Injury: Teeth and Oropharynx as per pre-operative assessment

## 2023-04-20 ENCOUNTER — Encounter (HOSPITAL_COMMUNITY): Payer: Self-pay | Admitting: General Surgery

## 2023-04-20 DIAGNOSIS — I10 Essential (primary) hypertension: Secondary | ICD-10-CM | POA: Diagnosis not present

## 2023-04-20 DIAGNOSIS — D0511 Intraductal carcinoma in situ of right breast: Secondary | ICD-10-CM | POA: Diagnosis not present

## 2023-04-20 DIAGNOSIS — Z1231 Encounter for screening mammogram for malignant neoplasm of breast: Secondary | ICD-10-CM | POA: Diagnosis not present

## 2023-04-20 NOTE — TOC Transition Note (Signed)
Transition of Care [facility]) - CM/SW Discharge Note   Patient Details  Name: Stephanie Roth MRN: [id number] Date of Birth: 09/25/1955  Transition of [facility]) CM/SW Contact:  Gordy Clement, RN Phone Number: 04/20/2023, 9:10 AM   Clinical Narrative:    Patient to DC to home today  No TOC needs Son to transport           Patient Goals and CMS Choice      Discharge Placement                         Discharge Plan and Services Additional resources added to the After Visit Summary for                                       Social Determinants of Health (SDOH) Interventions SDOH Screenings   Food Insecurity: No Food Insecurity (02/18/2023)  Housing: Low Risk  (02/18/2023)  Transportation Needs: No Transportation Needs (02/18/2023)  Utilities: Not At Risk (02/18/2023)  Depression (PHQ2-9): Low Risk  (02/18/2023)  Tobacco Use: Low Risk  (04/19/2023)     Readmission Risk Interventions     No data to display

## 2023-04-20 NOTE — Progress Notes (Signed)
Discharge instructions given to pt. Pt verbalized understanding of all teaching and no further questions. Pt currently in room waiting for son to pick her up

## 2023-04-20 NOTE — Progress Notes (Signed)
1 Day Post-Op   Subjective/Chief Complaint: No complaints other than some soreness   Objective: Vital signs in last 24 hours: Temp:  [97.5 F (36.4 C)-98.3 F (36.8 C)] 97.8 F (36.6 C) (11/05 0416) Pulse Rate:  [55-79] 57 (11/05 0416) Resp:  [11-24] 18 (11/05 0416) BP: (104-151)/(53-87) 107/53 (11/05 0416) SpO2:  [91 %-98 %] 98 % (11/05 0416) Weight:  [68 kg] 68 kg (11/04 0818)    Intake/Output from previous day: 11/04 0701 - 11/05 0700 In: 1071.3 [P.O.:240; I.V.:690.8; IV Piggyback:140.5] Out: 75 [Drains:55; Blood:20] Intake/Output this shift: No intake/output data recorded.  General appearance: alert and cooperative Resp: clear to auscultation bilaterally Chest wall: skin flaps look viable Cardio: regular rate and rhythm GI: soft, non-tender; bowel sounds normal; no masses,  no organomegaly  Lab Results:  No results for input(s): "WBC", "HGB", "HCT", "PLT" in the last 72 hours. BMET No results for input(s): "NA", "K", "CL", "CO2", "GLUCOSE", "BUN", "CREATININE", "CALCIUM" in the last 72 hours. PT/INR No results for input(s): "LABPROT", "INR" in the last 72 hours. ABG No results for input(s): "PHART", "HCO3" in the last 72 hours.  Invalid input(s): "PCO2", "PO2"  Studies/Results: NM Sentinel Node Inj-No Rpt (Breast)  Result Date: 04/19/2023 Sulfur Colloid was injected by the Nuclear Medicine Technologist for sentinel lymph node localization.    Anti-infectives: Anti-infectives (From admission, onward)    Start     Dose/Rate Route Frequency Ordered Stop   04/19/23 1800  ceFAZolin (ANCEF) IVPB 2g/100 mL premix        2 g 200 mL/hr over 30 Minutes Intravenous Every 8 hours 04/19/23 1238 04/26/23 1759   04/19/23 0815  ceFAZolin (ANCEF) IVPB 2g/100 mL premix  Status:  Discontinued        2 g 200 mL/hr over 30 Minutes Intravenous On call to O.R. 04/19/23 0809 04/19/23 0809   04/19/23 0815  ceFAZolin (ANCEF) IVPB 2g/100 mL premix        2 g 200 mL/hr over 30  Minutes Intravenous On call to O.R. 04/19/23 0808 04/19/23 1037       Assessment/Plan: s/p Procedure(s) with comments: RIGHT MASTECTOMY AND SENTINEL NODE BIOPSY (Right) - PEC BLOCK IMMEDIATE RIGHT BREAST RECONSTRUCTION WITH PLACEMENT OF TISSUE EXPANDER AND FLEX HD (ACELLULAR HYDRATED DERMIS) (Right) Advance diet Discharge Teach pt and family drain care  LOS: 0 days    [name] 04/20/2023

## 2023-04-20 NOTE — Discharge Summary (Signed)
Physician Discharge Summary  Patient ID: Stephanie Roth MRN: [id number] DOB/AGE: 1956-06-09 67 y.o.  Admit date: 04/19/2023 Discharge date: 04/20/2023  Admission Diagnoses:  Discharge Diagnoses:  Principal Problem:   Breast cancer [facility])   Discharged Condition: good  Hospital Course: 67 year old female here status post right mastectomy and sentinel lymph node biopsy by Dr. Carolynne Edouard followed by immediate right breast reconstruction placement tissue expander and Flex HD by Dr. Ulice Bold yesterday.  Patient stayed overnight for observation.  She is overall doing well this a.m., reports pain is overall well-controlled.  Has mild discomfort.  She had no acute overnight events.  Reports JP drains have been emptied approximately 2 times.  She is not having any infectious symptoms.  She is accompanied by her sister at bedside and feels comfortable going home and being discharged.  She reports is all of her medications at home and has no specific questions at this time.  Consults: None  Significant Diagnostic Studies: none  Treatments: IV hydration, antibiotics: Ancef, and analgesia: acetaminophen, Dilaudid, and oxycodone  Discharge Exam: Blood pressure 126/61, pulse 84, temperature 97.8 F (36.6 C), temperature source Oral, resp. rate 18, height 4\' 11"  (1.499 m), weight 68 kg, SpO2 98%. General appearance: alert, cooperative, and no distress Head: Normocephalic, without obvious abnormality, atraumatic Resp: unlabored Breasts: Right breast dressing is in place, unsoiled.  Right breast JP drains in place with small amount of dark sanguinous drainage in bulb.  No subcutaneous fluid collection noted, no hematoma noted.  Surgical site is mildly tender to palpation.  No erythema or cellulitic changes noted. Extremities: extremities normal, atraumatic, no cyanosis or edema  Disposition: Discharge disposition: 01-Home or Self Care       Discharge Instructions     Call MD for:  difficulty  breathing, headache or visual disturbances   Complete by: As directed    Call MD for:  extreme fatigue   Complete by: As directed    Call MD for:  hives   Complete by: As directed    Call MD for:  persistant dizziness or light-headedness   Complete by: As directed    Call MD for:  persistant nausea and vomiting   Complete by: As directed    Call MD for:  redness, tenderness, or signs of infection (pain, swelling, redness, odor or green/yellow discharge around incision site)   Complete by: As directed    Call MD for:  severe uncontrolled pain   Complete by: As directed    Call MD for:  temperature >100.4   Complete by: As directed    Diet - low sodium heart healthy   Complete by: As directed    Increase activity slowly   Complete by: As directed       Allergies as of 04/20/2023       Reactions   Monosodium Glutamate Swelling   Facial swelling        Medication List     TAKE these medications    amLODipine 2.5 MG tablet Commonly known as: NORVASC Take 2.5 mg by mouth daily.   BIOTIN PO Take 1 tablet by mouth daily. Hair   calcium carbonate 500 MG chewable tablet Commonly known as: TUMS - dosed in mg elemental calcium Chew 1 tablet by mouth daily as needed (for calcium).   fexofenadine 60 MG tablet Commonly known as: ALLEGRA Take 60 mg by mouth daily.   FLAXSEED OIL PO Take 1,300 mg by mouth 2 (two) times daily.   HONEY PO Take 1 each  by mouth as needed (When drink tea).   loratadine 10 MG tablet Commonly known as: CLARITIN Take 10 mg by mouth daily.   niacin 500 MG ER tablet Commonly known as: VITAMIN B3 Take 500 mg by mouth daily as needed (cholesterol).   ondansetron 4 MG tablet Commonly known as: Zofran Take 1 tablet (4 mg total) by mouth every 8 (eight) hours as needed for nausea or vomiting.   polyethylene glycol powder 17 GM/SCOOP powder Commonly known as: GLYCOLAX/MIRALAX Take 1 Container by mouth once.   PREVAGEN PO Take 1 tablet by  mouth every 14 (fourteen) days.   Vitamin D3 50 MCG (2000 UT) Tabs Take 2,000 Units by mouth daily.   ICAPS AREDS 2 PO Take 1 tablet by mouth daily.   WOMENS MULTIVITAMIN PO Take 1 tablet by mouth daily.        Follow-up Information     Dillingham, Alena Bills, DO Follow up in 10 day(s).   Specialty: Plastic Surgery Contact information: [address] [place] [address] [phone]         [name], MD Follow up in 2 week(s).   Specialty: General Surgery Contact information: [address] [place] [address] [phone]                 [facility] [address] [place], [place] [address] [phone]  Signed: Kermit Balo Antwon Rochin 04/20/2023, 12:17 PM

## 2023-04-21 LAB — SURGICAL PATHOLOGY

## 2023-04-22 ENCOUNTER — Telehealth: Payer: Self-pay | Admitting: *Deleted

## 2023-04-22 DIAGNOSIS — D0511 Intraductal carcinoma in situ of right breast: Secondary | ICD-10-CM

## 2023-04-22 NOTE — Telephone Encounter (Signed)
Spoke to pt regarding length of time to wear binder (2 wks). Received verbal understanding. Discussed reasoning. Pt relate doing well after surgery. Denies further questions or needs at this time.

## 2023-04-23 ENCOUNTER — Encounter: Payer: Self-pay | Admitting: Plastic Surgery

## 2023-04-23 ENCOUNTER — Ambulatory Visit (INDEPENDENT_AMBULATORY_CARE_PROVIDER_SITE_OTHER): Payer: Medicare Other | Admitting: Plastic Surgery

## 2023-04-23 VITALS — BP 157/79 | HR 79

## 2023-04-23 DIAGNOSIS — D489 Neoplasm of uncertain behavior, unspecified: Secondary | ICD-10-CM

## 2023-04-23 DIAGNOSIS — Z9889 Other specified postprocedural states: Secondary | ICD-10-CM

## 2023-04-23 DIAGNOSIS — D0511 Intraductal carcinoma in situ of right breast: Secondary | ICD-10-CM

## 2023-04-23 NOTE — Progress Notes (Signed)
Patient is a 67 year old female here for follow-up after right immediate breast reconstruction placement tissue expander and Flex HD with Dr. Ulice Bold on 04/19/2023 in conjunction with a mastectomy by Dr. Carolynne Edouard.  Patient is 4 days postop.  She is here today with family, she reports she is overall doing well.  She is not having any specific issues or complaints at this time.  Pain is well-controlled.  She reports JP drain output has been somewhere between 20 to 50 cc per 24 hours in each drain.  Of note, she had the expander placed over her pectoralis muscle.  Chaperone present on exam On exam right breast without any significant swelling or subcutaneous fluid collection.  No hematoma noted.  Bilateral JP drains in place with serosanguineous drainage in bulbs.  There is no erythema or cellulitic changes noted.  No tenderness noted with palpation.  Dressings are in place and nonsoiled.  A/P:  Patient is doing well overall.  She is 4 days postop.  Dr. Ulice Bold was present for today's encounter, we discussed expansion with the patient and placed 100 additional cc of fluid in the right breast tissue expander.  Patient tolerated this well.  We placed injectable saline in the Expander using a sterile technique: Right: 100 cc for a total of 350 / 535 cc  We will see her back in 1 week for possible JP drain removal.  There is no signs infection or concern on exam.  Recommend calling with questions or concerns.  Plan of care discussed with patient and her family.

## 2023-04-28 ENCOUNTER — Ambulatory Visit (INDEPENDENT_AMBULATORY_CARE_PROVIDER_SITE_OTHER): Payer: Medicare Other | Admitting: Surgical

## 2023-04-28 ENCOUNTER — Encounter: Payer: Self-pay | Admitting: Surgical

## 2023-04-28 VITALS — BP 141/85 | HR 71 | Ht 59.0 in | Wt 150.0 lb

## 2023-04-28 DIAGNOSIS — D0511 Intraductal carcinoma in situ of right breast: Secondary | ICD-10-CM

## 2023-04-28 DIAGNOSIS — D489 Neoplasm of uncertain behavior, unspecified: Secondary | ICD-10-CM

## 2023-04-28 NOTE — Progress Notes (Signed)
Patient is a 67 year old female here for follow-up after right immediate breast reconstruction placement tissue expander and Flex HD with Dr. Ulice Bold on 04/19/2023.  Is 9 days postop today.  She was last seen in the office on 04/23/2023.  Patient presents today with her sister and son.  She reports she is overall doing really well.  She reports JP drain output from drain 1 has been approximately 10 cc or less per 24 hours for the last few days.  She reports the drain to output has been approximately 20 to 30 cc per 24 hours.  She feels well, she does report some sensory changes and shocklike sensations to her right breast.  She is not having any infectious symptoms.  Pain is well-controlled.  Chaperone present on exam On exam right mastectomy incision is intact and healing very well, there is no signs of infection or concern.  No subcutaneous fluid collection noted with palpation.  JP drains in place with serosanguineous drainage in each bulb.  There is no erythema or cellulitic changes noted of her right breast.  Right breast is nontender.  A/P:  Patient is doing well after breast reconstruction surgery with tissue expander in place.  She tolerated her last expansion well and would like an additional fill today.  We discussed JP drain output and removed the drain 1 today.  We will plan to have her follow-up in 1 week for removal of the next drain.  There is no signs of infection or concern on exam.  All of her questions were answered to her content.  We discussed ongoing restrictions at this time.  We placed injectable saline in the Expander using a sterile technique: Right: 50 cc for a total of 400 / 535 cc

## 2023-04-29 ENCOUNTER — Inpatient Hospital Stay: Payer: Medicare Other | Attending: Hematology and Oncology | Admitting: Hematology and Oncology

## 2023-04-29 DIAGNOSIS — D0511 Intraductal carcinoma in situ of right breast: Secondary | ICD-10-CM

## 2023-04-29 DIAGNOSIS — Z9011 Acquired absence of right breast and nipple: Secondary | ICD-10-CM | POA: Diagnosis not present

## 2023-04-29 NOTE — Progress Notes (Signed)
Patient Care Team: Tally Joe, MD as PCP - General (Family Medicine) Donnelly Angelica, RN as Oncology Nurse Navigator Pershing Proud, RN as Oncology Nurse Navigator Serena Croissant, MD as Consulting Physician (Hematology and Oncology) Dorothy Puffer, MD as Consulting Physician (Radiation Oncology) Griselda Miner, MD as Consulting Physician (General Surgery)  DIAGNOSIS:  Encounter Diagnosis  Name Primary?   Ductal carcinoma in situ (DCIS) of right breast     SUMMARY OF ONCOLOGIC HISTORY: Oncology History  Ductal carcinoma in situ (DCIS) of right breast  02/08/2023 Initial Diagnosis   Screening detected right breast calcifications spanning 9.5 cm.  2 biopsies performed anterior and posterior reveal high-grade DCIS with foci suspicious for microinvasion ER 0%, PR 0%, axilla negative   02/17/2023 Cancer Staging   Staging form: Breast, AJCC 8th Edition - Clinical stage from 02/17/2023: Stage 0 (cTis (DCIS), cN0, cM0, PR-, HER2: Not Assessed) - Signed by Serena Croissant, MD on 02/18/2023 Stage prefix: Initial diagnosis Nuclear grade: G3 Laterality: Right Staged by: Pathologist and managing physician Stage used in treatment planning: Yes National guidelines used in treatment planning: Yes Type of national guideline used in treatment planning: NCCN   02/27/2023 Genetic Testing   Negative [facility] CancerNext-Expanded +RNAinsight Panel.  Report date is 02/27/2023.   The CancerNext-Expanded gene panel offered by [facility] and includes sequencing, rearrangement, and RNA analysis for the following 71 genes:  AIP, ALK, APC, ATM, BAP1, BARD1, BMPR1A, BRCA1, BRCA2, BRIP1, CDC73, CDH1, CDK4, CDKN1B, CDKN2A, CHEK2, DICER1, FH, FLCN, KIF1B, LZTR1,MAX, MEN1, MET, MLH1, MSH2, MSH6, MUTYH, NF1, NF2, NTHL1, PALB2, PHOX2B, PMS2, POT1, PRKAR1A, PTCH1, PTEN, RAD51C,RAD51D, RB1, RET, SDHA, SDHAF2, SDHB, SDHC, SDHD, SMAD4, SMARCA4, SMARCB1, SMARCE1, STK11, SUFU, TMEM127, TP53,TSC1, TSC2 and VHL (sequencing and  deletion/duplication); AXIN2, CTNNA1, EGFR, EGLN1, HOXB13, KIT, MITF, MSH3, PDGFRA, POLD1 and POLE (sequencing only); EPCAM and GREM1 (deletion/duplication only).    04/19/2023 Surgery   Right mastectomy: DCIS grade 3 with comedonecrosis, margins negative, 0/9 lymph nodes negative, ER 0%, PR 0%     CHIEF COMPLIANT: Follow-up of DCIS status post surgery  HISTORY OF PRESENT ILLNESS:  History of Present Illness   The patient, with a history of DCIS, recently underwent surgery. She reports that she did "okay" post-surgery. Prior to the surgery, the patient was informed that she had DCIS, a precancerous condition . shes healing and recovering well     ALLERGIES:  is allergic to monosodium glutamate.  MEDICATIONS:  Current Outpatient Medications  Medication Sig Dispense Refill   amLODipine (NORVASC) 2.5 MG tablet Take 2.5 mg by mouth daily.     Apoaequorin (PREVAGEN PO) Take 1 tablet by mouth every 14 (fourteen) days.     BIOTIN PO Take 1 tablet by mouth daily. Hair     calcium carbonate (TUMS - DOSED IN MG ELEMENTAL CALCIUM) 500 MG chewable tablet Chew 1 tablet by mouth daily as needed (for calcium).     Cholecalciferol (VITAMIN D3) 2000 units TABS Take 2,000 Units by mouth daily.     fexofenadine (ALLEGRA) 60 MG tablet Take 60 mg by mouth daily.     Flaxseed, Linseed, (FLAXSEED OIL PO) Take 1,300 mg by mouth 2 (two) times daily.     HONEY PO Take 1 each by mouth as needed (When drink tea).     loratadine (CLARITIN) 10 MG tablet Take 10 mg by mouth daily.     Multiple Vitamins-Minerals (ICAPS AREDS 2 PO) Take 1 tablet by mouth daily.     Multiple Vitamins-Minerals (WOMENS MULTIVITAMIN  PO) Take 1 tablet by mouth daily.     niacin (VITAMIN B3) 500 MG ER tablet Take 500 mg by mouth daily as needed (cholesterol).     ondansetron (ZOFRAN) 4 MG tablet Take 1 tablet (4 mg total) by mouth every 8 (eight) hours as needed for nausea or vomiting. 20 tablet 0   polyethylene glycol powder  (GLYCOLAX/MIRALAX) 17 GM/SCOOP powder Take 1 Container by mouth once.     No current facility-administered medications for this visit.    PHYSICAL EXAMINATION: ECOG PERFORMANCE STATUS: 1 - Symptomatic but completely ambulatory  Vitals:   04/29/23 1402  BP: (!) 164/80  Pulse: 80  Resp: 18  Temp: 97.6 F (36.4 C)  SpO2: 99%   Filed Weights   04/29/23 1402  Weight: 154 lb 4.8 oz (70 kg)    LABORATORY DATA:  I have reviewed the data as listed    Latest Ref Rng & Units 04/15/2023   10:16 AM 02/17/2023   12:50 PM 03/03/2018   11:44 AM  CMP  Glucose 70 - 99 mg/dL 161  096  86   BUN 8 - 23 mg/dL 28  20  21    Creatinine 0.44 - 1.00 mg/dL 0.45  4.09  8.11   Sodium 135 - 145 mmol/L 139  139  143   Potassium 3.5 - 5.1 mmol/L 3.6  3.8  4.7   Chloride 98 - 111 mmol/L 108  106  104   CO2 22 - 32 mmol/L 24  28  23    Calcium 8.9 - 10.3 mg/dL 9.4  9.8  91.4   Total Protein 6.5 - 8.1 g/dL  7.9  7.6   Total Bilirubin 0.3 - 1.2 mg/dL  0.3  0.2   Alkaline Phos 38 - 126 U/L  107  100   AST 15 - 41 U/L  16  21   ALT 0 - 44 U/L  16  22     Lab Results  Component Value Date   WBC 7.0 04/15/2023   HGB 13.0 04/15/2023   HCT 40.0 04/15/2023   MCV 90.7 04/15/2023   PLT 416 (H) 04/15/2023   NEUTROABS 4.3 02/17/2023    ASSESSMENT & PLAN:  Ductal carcinoma in situ (DCIS) of right breast 04/19/2023:Right mastectomy: DCIS grade 3 with comedonecrosis, margins negative, 0/9 lymph nodes negative, ER 0%, PR 0%  Pathology counseling: I discussed the final pathology report of the patient provided  a copy of this report. I discussed the margins.  We also discussed the final staging along with previously performed ER/PR testing.  Assessment and Plan    Ductal Carcinoma In Situ (DCIS) Post-mastectomy with negative margins and no lymph node involvement. No invasive cancer identified. Hormone receptor negative, thus no need for hormone therapy. - No further treatment required. - Patient to follow up  with nurse practitioner, Mardella Layman, in three months for surveillance plan discussion.  Genetic predisposition Patient has undergone genetic testing with a complete panel of 71 genes, all of which were negative. - No further genetic testing required.  Follow-up - Schedule appointment with nurse practitioner, Mardella Layman, in three months.  After that she could be followed by Mardella Layman once a year for surveillance and long-term survivorship.     No orders of the defined types were placed in this encounter.  The patient has a good understanding of the overall plan. she agrees with it. she will call with any problems that may develop before the next visit here. Total time spent: 30 mins including  face to face time and time spent for planning, charting and co-ordination of care   Tamsen Meek, MD 04/30/23

## 2023-04-29 NOTE — Assessment & Plan Note (Signed)
04/19/2023:Right mastectomy: DCIS grade 3 with comedonecrosis, margins negative, 0/9 lymph nodes negative, ER 0%, PR 0%  Pathology counseling: I discussed the final pathology report of the patient provided  a copy of this report. I discussed the margins.  We also discussed the final staging along with previously performed ER/PR testing.  Treatment plan:

## 2023-04-30 ENCOUNTER — Other Ambulatory Visit: Payer: Self-pay | Admitting: Surgical

## 2023-04-30 MED ORDER — DIAZEPAM 2 MG PO TABS: 2.0000 mg | ORAL_TABLET | Freq: Two times a day (BID) | ORAL | 0 refills | Status: DC | PRN

## 2023-04-30 NOTE — Progress Notes (Signed)
Postop medications sent to pharmacy, discussed valium with patient at her appt for muscle spasms.

## 2023-05-05 ENCOUNTER — Encounter: Payer: Medicare Other | Admitting: Student

## 2023-05-05 ENCOUNTER — Ambulatory Visit: Payer: Medicare Other | Admitting: Surgical

## 2023-05-05 ENCOUNTER — Encounter: Payer: Self-pay | Admitting: Surgical

## 2023-05-05 VITALS — BP 143/83

## 2023-05-05 DIAGNOSIS — D489 Neoplasm of uncertain behavior, unspecified: Secondary | ICD-10-CM

## 2023-05-05 DIAGNOSIS — D0511 Intraductal carcinoma in situ of right breast: Secondary | ICD-10-CM

## 2023-05-05 DIAGNOSIS — Z9889 Other specified postprocedural states: Secondary | ICD-10-CM

## 2023-05-05 NOTE — Progress Notes (Signed)
Patient is a 67 year old female here for follow-up on her right breast reconstruction.  She underwent immediate breast reconstruction placement tissue expander and Flex HD with Dr. Ulice Bold on 04/19/2023.  She is just over 2 weeks postop.  Of note, her expanders were placed in the prepectoral plane. She currently has 400/535 cc in her right tissue expander  Patient reports JP drain output over the last 3 days has been approximately 10 to 20 cc.  She reports she is overall doing very well today, she is not having any infectious symptoms or concerns at this time.  She is interested in additional expansion today.  Chaperone present on exam On exam right JP drain is in place with serosanguineous drainage in bulb.  Right breast incisions are intact and appear to be healing well.  She still has Steri-Strips in place.  There is no subcutaneous fluid collection noted.  No erythema or cellulitic changes noted.  Right breast is minimally tender with palpation.  A/P:  Patient is overall doing well, she is 2 weeks postop from immediate breast reconstruction with tissue expander in place.  Right JP drain was removed, patient tolerated this well.  Discussed importance of continue to avoid strenuous activities, heavy lifting, continue with compressive garments.  There is no signs of infection or concern on exam.  We placed injectable saline in the Expander using a sterile technique: Right: 50 cc for a total of 450 / 535 cc  Recommend following up in 2 weeks for reevaluation.

## 2023-05-07 ENCOUNTER — Telehealth: Payer: Self-pay | Admitting: Plastic Surgery

## 2023-05-07 NOTE — Telephone Encounter (Signed)
Pt called and would not tell me what she wanted to talk to about, Maybe a CMA could speak with her. She said it was personal about her care and wanted to speak to the provider. Thank you

## 2023-05-07 NOTE — Telephone Encounter (Signed)
Called pt and she stated she only had questions about getting steri strips wet and showering. I adv pt that is ok to get the steri strips wet in the shower but do not submerge her breast in water. Adv no scrubbing, pat dry and only clip the pieces of steri strips that are hanging with scissors leaving the rest of it intact. Pt conveyed understanding.

## 2023-05-12 ENCOUNTER — Telehealth: Payer: Self-pay

## 2023-05-12 NOTE — Telephone Encounter (Signed)
Patient called earlier today asking that I call her back. I called patient back after clinic and she reported for the past few days she's been feeling this bubbling/sloshing sensation in her right breast. She doesn't report any pain unless she moves the wrong way. No report of n/v, headache, fever, redness or swelling. Patient asked if this is normal.   She also reported stiffness in her R index finger and didn't know if there was any relation to the surgery. I asked her if she has arthritis or ever had trigger finger and she said she has but it's been in her left hand. I advised that I would forward her message to a PA and would call her back once I received a response from them. She conveyed understanding.   I'm not sure who is on call so I sent to everyone!   Thanks for your help!

## 2023-05-17 NOTE — Telephone Encounter (Signed)
Please schedule patient for appointment with me or one of the other APPs this week. She has an appointment on 12/6, if she would like to wait until then to see Dr. Ulice Bold and is doing fine, she can.   If she would like to be seen sooner, I can see tomorrow

## 2023-05-21 ENCOUNTER — Ambulatory Visit: Payer: Medicare Other | Admitting: Plastic Surgery

## 2023-05-21 ENCOUNTER — Encounter: Payer: Self-pay | Admitting: Plastic Surgery

## 2023-05-21 VITALS — BP 153/83 | HR 73 | Ht 59.0 in | Wt 150.0 lb

## 2023-05-21 DIAGNOSIS — D0511 Intraductal carcinoma in situ of right breast: Secondary | ICD-10-CM

## 2023-05-21 NOTE — Progress Notes (Signed)
   Subjective:    Patient ID: Stephanie Roth, female    DOB: 10/14/1955, 67 y.o.   MRN: [id number]  The patient is a 67 year old female here for follow-up after undergoing surgery on November 4.  She had a right immediate breast reconstruction with placement of an expander and acellular dermal matrix.  At the time of surgery she had 250 cc of saline placed in the expander.  Based on the last note she had from the office on November 20 she has 450 cc and a 535 cc expander.  Has a little bit of a seroma of the right breast.      Review of Systems  HENT: Negative.    Eyes: Negative.   Respiratory: Negative.    Cardiovascular: Negative.   Genitourinary: Negative.        Objective:   Physical Exam Skin:    General: Skin is warm.     Capillary Refill: Capillary refill takes less than 2 seconds.     Coloration: Skin is not jaundiced.     Findings: No bruising.  Neurological:     Mental Status: She is oriented to person, place, and time.  Psychiatric:        Mood and Affect: Mood normal.        Behavior: Behavior normal.        Judgment: Judgment normal.           Assessment & Plan:     ICD-10-CM   1. Ductal carcinoma in situ (DCIS) of right breast  D05.11       We placed injectable saline in the Expander using a sterile technique: Right: 50 cc for a total of 500 / 535 cc  I was able to remove 20 cc in the breast pocket.  This was a seroma.

## 2023-05-31 ENCOUNTER — Ambulatory Visit (INDEPENDENT_AMBULATORY_CARE_PROVIDER_SITE_OTHER): Payer: Medicare Other | Admitting: Student

## 2023-05-31 DIAGNOSIS — D0511 Intraductal carcinoma in situ of right breast: Secondary | ICD-10-CM

## 2023-05-31 NOTE — Progress Notes (Signed)
Patient is a 67 year old female with history of right breast cancer.  She underwent right immediate breast reconstruction with placement of acellular dermal matrix and a tissue expander with Dr. Ulice Bold on 04/19/2023 in conjunction with Dr. Carolynne Edouard who performed a right mastectomy and sentinel node biopsy.  Intraoperatively, patient had a [place] 535 cc expander placed, with 250 cc of injectable saline placed in the expander at the time of surgery.  Patient presents to the clinic today for post operative follow-up.  She is 6 weeks postop.  Patient was last seen in the clinic on 05/21/2023 by Dr. Ulice Bold.  At this visit, patient was noted to have a little bit of a seroma to her right breast.  20 cc of fluid were removed from the breast pocket, and 50 cc of injectable saline were placed in the expander for a total of 500 cc / 535 cc.  Today, patient reports she is doing well.  She denies any issues since her most previous visit.  She denies any fevers or chills.  Denies any pain since her last expander fill.  States that she would be interested in another 1 today.  Chaperone present on exam.  On exam, patient is sitting upright in no acute distress.  Right breast expander is in place.  It is soft.  There is no overlying erythema.  There is a significant amount of excess tissue, especially noted at the inferior aspect of the breast.  There is possibly some fluid in this area.  There are no overlying skin changes.  No tenderness to palpation.  Steri-Strips were in place over the incision.  These were removed.  Incision is intact and healing well.  No signs of infection on exam.  We placed injectable saline in the Expander using a sterile technique: Right: 50 cc for a total of 550 cc/535 cc  I did attempt aspiration over the port site as well as out laterally.  Alcohol wipe and sterile butterfly needle were utilized for both of these attempts.  I was not able to aspirate any fluid.  Discussed with patient  that I would like her to wear her compression at all times.  Discussed with her she may start gradually increasing her activities, do bit to make sure she is wearing compression as increased activity can contribute to fluid accumulation.  Patient expressed understanding.  Patient to follow back up in another 1 to 2 weeks.  Instructed her to call if she experiences any redness, swelling, pain, fevers, chills, or has any concerns or questions.  Pictures were obtained of the patient and placed in the chart with the patient's or guardian's permission.

## 2023-06-03 DIAGNOSIS — C50911 Malignant neoplasm of unspecified site of right female breast: Secondary | ICD-10-CM | POA: Diagnosis not present

## 2023-06-15 ENCOUNTER — Encounter: Payer: Self-pay | Admitting: Surgical

## 2023-06-15 ENCOUNTER — Ambulatory Visit (INDEPENDENT_AMBULATORY_CARE_PROVIDER_SITE_OTHER): Payer: Medicare Other | Admitting: Surgical

## 2023-06-15 VITALS — BP 145/80 | HR 67

## 2023-06-15 DIAGNOSIS — D0511 Intraductal carcinoma in situ of right breast: Secondary | ICD-10-CM

## 2023-06-15 DIAGNOSIS — D489 Neoplasm of uncertain behavior, unspecified: Secondary | ICD-10-CM

## 2023-06-15 DIAGNOSIS — C50911 Malignant neoplasm of unspecified site of right female breast: Secondary | ICD-10-CM | POA: Diagnosis not present

## 2023-06-15 NOTE — Progress Notes (Signed)
 67 year old female here for follow-up on her right breast reconstruction.  She was last seen in the clinic on 05/31/2023.  She has 550 cc in her right expander at this time.  At her last appointment she had a possible seroma which was attempted to be aspirated.  No fluid was collected.  She reports she is overall doing well, she reports she is not having any issues since her last appointment.  She reports that she is unsure if she is wanting another fill today or not.  She has questions about what her current cup size is estimated to be.  Chaperone present on exam On exam right breast incision is intact and healing well.  Right breast expander is in place, there is a small amount of surrounding seroma noted when the patient is sitting in an upright position.  There is no overlying skin changes.  There is no erythema or cellulitic changes noted.  She does have some excess skin present.  NAC is surgically absent.  Breasts are significantly asymmetric, left breast is significantly larger.   A/P:  Discussed with patient that I would like her to think about the size of her right breast expander over the next few weeks and decide if she would like any additional fills.  We discussed that her estimated volume at this time is likely a large C cup/small D cup, we did discuss that this is entirely dependent on where she purchases her bra as measurements can vary.  Patient was agreeable to this.  She is going to think about this further.  We did discuss having her follow-up next in a few weeks with Dr. Lowery for evaluation and to discuss right breast expander to implant exchange and left breast reduction for improved symmetry.  Patient was agreeable to this plan.  We will plan to see her at that point.  Recommend calling with questions or concerns.  We did not aspirate the right breast as I did not feel as if there was enough fluid to warrant aspiration and the fluid was very close to the lower pole of  the breast which would result in an increased risk of expander puncture.  Pictures were obtained of the patient and placed in the chart with the patient's or guardian's permission.

## 2023-06-17 ENCOUNTER — Telehealth: Payer: Self-pay | Admitting: Plastic Surgery

## 2023-06-17 NOTE — Telephone Encounter (Signed)
 Pt wants to know if she is allowed to get the flu shot? Please advise clinical question

## 2023-06-17 NOTE — Telephone Encounter (Signed)
 Called patient, she can get flu shot. Recommend in her L arm since she had right sided mastectomy and recon. She reports no lymph node removal

## 2023-06-22 ENCOUNTER — Other Ambulatory Visit: Payer: Self-pay | Admitting: Plastic Surgery

## 2023-06-22 DIAGNOSIS — D0511 Intraductal carcinoma in situ of right breast: Secondary | ICD-10-CM

## 2023-06-29 ENCOUNTER — Telehealth: Payer: Self-pay | Admitting: Plastic Surgery

## 2023-06-29 ENCOUNTER — Ambulatory Visit (INDEPENDENT_AMBULATORY_CARE_PROVIDER_SITE_OTHER): Payer: Medicare Other | Admitting: Student

## 2023-06-29 ENCOUNTER — Encounter: Payer: Medicare Other | Admitting: Plastic Surgery

## 2023-06-29 VITALS — BP 134/84 | HR 71

## 2023-06-29 DIAGNOSIS — C50911 Malignant neoplasm of unspecified site of right female breast: Secondary | ICD-10-CM | POA: Diagnosis not present

## 2023-06-29 DIAGNOSIS — D0511 Intraductal carcinoma in situ of right breast: Secondary | ICD-10-CM

## 2023-06-29 MED ORDER — OXYCODONE HCL 5 MG PO TABS: 5.0000 mg | ORAL_TABLET | Freq: Four times a day (QID) | ORAL | 0 refills | Status: DC | PRN

## 2023-06-29 MED ORDER — CEPHALEXIN 500 MG PO CAPS: 500.0000 mg | ORAL_CAPSULE | Freq: Four times a day (QID) | ORAL | 0 refills | Status: AC

## 2023-06-29 MED ORDER — ONDANSETRON HCL 4 MG PO TABS: 4.0000 mg | ORAL_TABLET | Freq: Three times a day (TID) | ORAL | 0 refills | Status: DC | PRN

## 2023-06-29 NOTE — Progress Notes (Signed)
 Patient ID: Stephanie Roth, female    DOB: 1956-03-13, 68 y.o.   MRN: [id number]  Chief Complaint  Patient presents with   Pre-op Exam      ICD-10-CM   1. Ductal carcinoma in situ (DCIS) of right breast  D05.11        History of Present Illness: Stephanie Roth is a 68 y.o.  female  with a history of breast cancer.  She presents for preoperative evaluation for upcoming procedure, left breast reduction and removal of tissue expander and placement of implant, scheduled for 07/14/23 with Dr. Lowery.  The patient reports that she did stop breathing during a colonoscopy once in 2020.  She states otherwise she has not had any issues with anesthesia.  Patient denies any history of cardiac disease.  She denies taking any blood thinners.  Patient reports she is not a smoker.  Patient denies taking any birth control or hormone replacement.  She denies any history of miscarriages.  She denies any personal or family history of blood clots or clotting diseases.  She denies any recent traumas, surgeries, infections.  She denies any history of stroke or heart attack.  She denies any history of Crohn's disease, ulcerative colitis.  She denies any history of COPD or asthma.  She denies any recent fevers, chills or changes in her health.  Summary of Previous Visit: Patient was most recently seen on 06/15/2023.  At this visit, patient was doing well.  She was unsure if she wanted another fill.  On exam, right breast incision was intact and healing well.  Right breast expanders in place, there was a small amount of surrounding seroma.  Plan was for patient to think about what size she would like to be further and if she would like any additional fills.  Today (06/29/2023), patient reports she is doing well.  She states that she has a little bit of soreness to her right axillary region.  Denies any fevers or chills.  Denies any other issues or concerns.  Dr. Lowery was able to be present during today's  visit.  They discussed size, patient states that she would like a little bit more in her expander as she would like to be around a C cup.    We placed injectable saline in the Expander using a sterile technique: Right: 50 cc for a total of 600 / 535 cc  Patient currently has 600 cc / 535 cc in her R breast expander  Job: Works in community education officer, planning to take 1 week off  PMH Significant for: Breast cancer, hypertension   Past Medical History: Allergies: Allergies  Allergen Reactions   Monosodium Glutamate Swelling    Facial swelling    Current Medications:  Current Outpatient Medications:    amLODipine (NORVASC) 2.5 MG tablet, Take 2.5 mg by mouth daily., Disp: , Rfl:    Apoaequorin (PREVAGEN PO), Take 1 tablet by mouth every 14 (fourteen) days., Disp: , Rfl:    BIOTIN PO, Take 1 tablet by mouth daily. Hair, Disp: , Rfl:    calcium carbonate (TUMS - DOSED IN MG ELEMENTAL CALCIUM) 500 MG chewable tablet, Chew 1 tablet by mouth daily as needed (for calcium)., Disp: , Rfl:    Cholecalciferol (VITAMIN D3) 2000 units TABS, Take 2,000 Units by mouth daily., Disp: , Rfl:    diazepam  (VALIUM ) 2 MG tablet, Take 1 tablet (2 mg total) by mouth every 12 (twelve) hours as needed for up to 10 doses for  muscle spasms., Disp: 10 tablet, Rfl: 0   fexofenadine (ALLEGRA) 60 MG tablet, Take 60 mg by mouth daily., Disp: , Rfl:    Flaxseed, Linseed, (FLAXSEED OIL PO), Take 1,300 mg by mouth 2 (two) times daily., Disp: , Rfl:    HONEY PO, Take 1 each by mouth as needed (When drink tea)., Disp: , Rfl:    loratadine (CLARITIN) 10 MG tablet, Take 10 mg by mouth daily., Disp: , Rfl:    Multiple Vitamins-Minerals (ICAPS AREDS 2 PO), Take 1 tablet by mouth daily., Disp: , Rfl:    Multiple Vitamins-Minerals (WOMENS MULTIVITAMIN PO), Take 1 tablet by mouth daily., Disp: , Rfl:    niacin (VITAMIN B3) 500 MG ER tablet, Take 500 mg by mouth daily as needed (cholesterol)., Disp: , Rfl:    ondansetron  (ZOFRAN ) 4 MG  tablet, Take 1 tablet (4 mg total) by mouth every 8 (eight) hours as needed for nausea or vomiting., Disp: 20 tablet, Rfl: 0   polyethylene glycol powder (GLYCOLAX /MIRALAX ) 17 GM/SCOOP powder, Take 1 Container by mouth once., Disp: , Rfl:   Past Medical Problems: Past Medical History:  Diagnosis Date   Cancer (HCC) 02/2023   right breast DCIS   DUB (dysfunctional uterine bleeding)    Fibroids    uterine   Goiter 06/16/2007   Hypertension    Hyperthyroidism    Iron deficiency anemia    Joint pain    Menopause     Past Surgical History: Past Surgical History:  Procedure Laterality Date   blepheroplasty Bilateral    BREAST BIOPSY Right 02/08/2023   MM RT BREAST BX W LOC DEV 1ST LESION IMAGE BX SPEC STEREO GUIDE 02/08/2023 [facility]   BREAST BIOPSY Right 02/08/2023   MM RT BREAST BX W LOC DEV EA AD LESION IMG BX SPEC STEREO GUIDE 02/08/2023 [facility]   BREAST RECONSTRUCTION WITH PLACEMENT OF TISSUE EXPANDER AND FLEX HD (ACELLULAR HYDRATED DERMIS) Right 04/19/2023   Procedure: IMMEDIATE RIGHT BREAST RECONSTRUCTION WITH PLACEMENT OF TISSUE EXPANDER AND FLEX HD (ACELLULAR HYDRATED DERMIS);  Surgeon: Lowery Estefana RAMAN, DO;  Location: [facility] OR;  Service: Plastics;  Laterality: Right;   CESAREAN SECTION     ECTOPIC PREGNANCY SURGERY     GUM SURGERY     MASTECTOMY W/ SENTINEL NODE BIOPSY Right 04/19/2023   Procedure: RIGHT MASTECTOMY AND SENTINEL NODE BIOPSY;  Surgeon: Curvin Deward MOULD, MD;  Location: [facility] OR;  Service: General;  Laterality: Right;  PEC BLOCK   TUBAL LIGATION      Social History: Social History   Socioeconomic History   Marital status: Divorced    Spouse name: Not on file   Number of children: Not on file   Years of education: Not on file   Highest education level: Not on file  Occupational History   Not on file  Tobacco Use   Smoking status: Never   Smokeless tobacco: Never  Vaping Use   Vaping status: Never Used  Substance and Sexual Activity    Alcohol use: Never   Drug use: Never   Sexual activity: Not Currently    Birth control/protection: Post-menopausal, Surgical    Comment: BTL  Other Topics Concern   Not on file  Social History Narrative   Not on file   Social Drivers of Health   Financial Resource Strain: Not on file  Food Insecurity: No Food Insecurity (04/20/2023)   Hunger Vital Sign    Worried About Running Out of Food in the Last Year: Never true  Ran Out of Food in the Last Year: Never true  Transportation Needs: No Transportation Needs (04/20/2023)   PRAPARE - Administrator, Civil Service (Medical): No    Lack of Transportation (Non-Medical): No  Physical Activity: Not on file  Stress: Not on file  Social Connections: Not on file  Intimate Partner Violence: Not At Risk (04/20/2023)   Humiliation, Afraid, Rape, and Kick questionnaire    Fear of Current or Ex-Partner: No    Emotionally Abused: No    Physically Abused: No    Sexually Abused: No    Family History: Family History  Problem Relation Age of Onset   Hypertension Mother    COPD Mother    Congestive Heart Failure Mother    Arthritis Mother    Hypertension Father    Aortic aneurysm Father    Myelodysplastic syndrome Father 80   Psoriasis Sister    Eczema Sister    Arthritis Sister    Cancer Maternal Grandfather    Prostate cancer Maternal Grandfather        mets   Cancer Maternal Uncle 83       unknown type   Cancer Cousin    Colon cancer Cousin 29       paternal female cousin   Breast cancer Cousin 7       paternal female cousin   Cancer Cousin 4       unk type; pat female cousin   Cervical cancer Cousin 50       mat cousin   Cancer Cousin        x5 pat cousins; unknown type; 4 females--one dx 55; one female dx unknown age    Review of Systems: Denies any fevers or chills  Physical Exam: Vital Signs BP 134/84 (BP Location: Right Arm, Patient Position: Sitting, Cuff Size: Large)   Pulse 71   SpO2 97%    Physical Exam  Constitutional:      General: Not in acute distress.    Appearance: Normal appearance. Not ill-appearing.  HENT:     Head: Normocephalic and atraumatic.  Neck:     Musculoskeletal: Normal range of motion.  Cardiovascular:     Rate and Rhythm: Normal rate Pulmonary:     Effort: Pulmonary effort is normal. No respiratory distress.  Breast: Right breast expander is in place, it is soft.  There is no overlying erythema.  No overlying erythema or skin changes noted out laterally in the axilla.  There does appear to be a little bit of fluid noted within the breast cavity.  No signs of infection on exam.  There is significant excess skin noted to the right breast.  Incision is well-healed to the right breast Musculoskeletal: Normal range of motion.  Skin:    General: Skin is warm and Roth.     Findings: No erythema or rash.  Neurological:     Mental Status: Alert and oriented to person, place, and time. Mental status is at baseline.  Psychiatric:        Mood and Affect: Mood normal.        Behavior: Behavior normal.    Assessment/Plan: The patient is scheduled for left breast reduction and removal of tissue expander and placement of implant to the right breast with Dr. Lowery.  Risks, benefits, and alternatives of procedure discussed, questions answered and consent obtained.    Smoking Status: Non-smoker; Counseling Given?  N/A Last Mammogram: 02/04/2023; Results: BI-RADS Category 5  Caprini Score: 7; Risk  Factors include: Age, BMI > 25, history of malignancy and length of planned surgery. Recommendation for mechanical prophylaxis. Does not appear patient had lovenox for previous surgery. Encourage early ambulation.   Pictures obtained: 06/15/2023  Post-op Rx sent to pharmacy: Oxycodone , Zofran , Keflex   Instructed the patient to hold any multivitamins or supplements at least 1 week prior to surgery.  Patient expressed understanding.  Patient was provided with the  the [facility] breast implant registry list, the breast reduction consent form, the [facility] implant checklist and General Surgical Risk consent document and Pain Medication Agreement prior to their appointment.  They had adequate time to read through the risk consent documents and Pain Medication Agreement. We also discussed them in person together during this preop appointment. All of their questions were answered to their satisfaction.  Recommended calling if they have any further questions.  Risk consent form and Pain Medication Agreement to be scanned into patient's chart.  The risk that can be encountered with breast reduction were discussed and include the following but not limited to these:  Breast asymmetry, fluid accumulation, firmness of the breast, inability to breast feed, loss of nipple or areola, skin loss, decrease or no nipple sensation, fat necrosis of the breast tissue, bleeding, infection, healing delay.  There are risks of anesthesia, changes to skin sensation and injury to nerves or blood vessels.  The muscle can be temporarily or permanently injured.  You may have an allergic reaction to tape, suture, glue, blood products which can result in skin discoloration, swelling, pain, skin lesions, poor healing.  Any of these can lead to the need for revisonal surgery or stage procedures.  A reduction has potential to interfere with diagnostic procedures.  Nipple or breast piercing can increase risks of infection.  This procedure is best done when the breast is fully developed.  Changes in the breast will continue to occur over time.  Pregnancy can alter the outcomes of previous breast reduction surgery, weight gain and weigh loss can also effect the long term appearance.    The consent was obtained with risks and complications reviewed which included bleeding, pain, scar, infection and the risk of anesthesia.  The patients questions were answered to the patients expressed satisfaction.     Electronically signed by: Estefana FORBES Peck, PA-C 06/29/2023 2:52 PM

## 2023-06-29 NOTE — Telephone Encounter (Signed)
 Was told by Caroline More to cancel [facility] 2 PM appointment. She was on schedule twice for today 1/14

## 2023-07-04 DIAGNOSIS — U071 COVID-19: Secondary | ICD-10-CM | POA: Diagnosis not present

## 2023-07-07 ENCOUNTER — Telehealth: Payer: Self-pay | Admitting: Plastic Surgery

## 2023-07-14 ENCOUNTER — Encounter (HOSPITAL_BASED_OUTPATIENT_CLINIC_OR_DEPARTMENT_OTHER): Payer: Self-pay

## 2023-07-14 ENCOUNTER — Ambulatory Visit (HOSPITAL_BASED_OUTPATIENT_CLINIC_OR_DEPARTMENT_OTHER): Admit: 2023-07-14 | Payer: Medicare Other | Admitting: Plastic Surgery

## 2023-07-14 SURGERY — MAMMOPLASTY, REDUCTION
Anesthesia: Choice | Site: Breast | Laterality: Right

## 2023-07-20 ENCOUNTER — Encounter: Payer: Medicare Other | Admitting: Plastic Surgery

## 2023-07-22 ENCOUNTER — Encounter: Payer: Self-pay | Admitting: *Deleted

## 2023-07-22 ENCOUNTER — Encounter: Payer: Self-pay | Admitting: Adult Health

## 2023-07-22 ENCOUNTER — Inpatient Hospital Stay: Payer: Medicare Other | Attending: Hematology and Oncology | Admitting: Adult Health

## 2023-07-22 VITALS — BP 138/74 | HR 70 | Temp 97.4°F | Resp 18 | Ht 59.0 in | Wt 154.5 lb

## 2023-07-22 DIAGNOSIS — Z808 Family history of malignant neoplasm of other organs or systems: Secondary | ICD-10-CM | POA: Diagnosis not present

## 2023-07-22 DIAGNOSIS — Z800 Family history of malignant neoplasm of digestive organs: Secondary | ICD-10-CM | POA: Diagnosis not present

## 2023-07-22 DIAGNOSIS — Z79899 Other long term (current) drug therapy: Secondary | ICD-10-CM | POA: Diagnosis not present

## 2023-07-22 DIAGNOSIS — D0511 Intraductal carcinoma in situ of right breast: Secondary | ICD-10-CM | POA: Diagnosis not present

## 2023-07-22 DIAGNOSIS — Z809 Family history of malignant neoplasm, unspecified: Secondary | ICD-10-CM | POA: Diagnosis not present

## 2023-07-22 DIAGNOSIS — Z8616 Personal history of COVID-19: Secondary | ICD-10-CM | POA: Insufficient documentation

## 2023-07-22 DIAGNOSIS — Z171 Estrogen receptor negative status [ER-]: Secondary | ICD-10-CM | POA: Insufficient documentation

## 2023-07-22 DIAGNOSIS — Z8042 Family history of malignant neoplasm of prostate: Secondary | ICD-10-CM | POA: Insufficient documentation

## 2023-07-22 DIAGNOSIS — Z803 Family history of malignant neoplasm of breast: Secondary | ICD-10-CM | POA: Diagnosis not present

## 2023-07-22 DIAGNOSIS — Z1722 Progesterone receptor negative status: Secondary | ICD-10-CM | POA: Diagnosis not present

## 2023-07-22 NOTE — Progress Notes (Signed)
 SURVIVORSHIP VISIT:  BRIEF ONCOLOGIC HISTORY:  Oncology History  Ductal carcinoma in situ (DCIS) of right breast  02/08/2023 Initial Diagnosis   Screening detected right breast calcifications spanning 9.5 cm.  2 biopsies performed anterior and posterior reveal high-grade DCIS with foci suspicious for microinvasion ER 0%, PR 0%, axilla negative   02/17/2023 Cancer Staging   Staging form: Breast, AJCC 8th Edition - Clinical stage from 02/17/2023: Stage 0 (cTis (DCIS), cN0, cM0, PR-, HER2: Not Assessed) - Signed by Odean Potts, MD on 02/18/2023 Stage prefix: Initial diagnosis Nuclear grade: G3 Laterality: Right Staged by: Pathologist and managing physician Stage used in treatment planning: Yes National guidelines used in treatment planning: Yes Type of national guideline used in treatment planning: NCCN   02/27/2023 Genetic Testing   Negative [facility] CancerNext-Expanded +RNAinsight Panel.  Report date is 02/27/2023.   The CancerNext-Expanded gene panel offered by [facility] and includes sequencing, rearrangement, and RNA analysis for the following 71 genes:  AIP, ALK, APC, ATM, BAP1, BARD1, BMPR1A, BRCA1, BRCA2, BRIP1, CDC73, CDH1, CDK4, CDKN1B, CDKN2A, CHEK2, DICER1, FH, FLCN, KIF1B, LZTR1,MAX, MEN1, MET, MLH1, MSH2, MSH6, MUTYH, NF1, NF2, NTHL1, PALB2, PHOX2B, PMS2, POT1, PRKAR1A, PTCH1, PTEN, RAD51C,RAD51D, RB1, RET, SDHA, SDHAF2, SDHB, SDHC, SDHD, SMAD4, SMARCA4, SMARCB1, SMARCE1, STK11, SUFU, TMEM127, TP53,TSC1, TSC2 and VHL (sequencing and deletion/duplication); AXIN2, CTNNA1, EGFR, EGLN1, HOXB13, KIT, MITF, MSH3, PDGFRA, POLD1 and POLE (sequencing only); EPCAM and GREM1 (deletion/duplication only).    04/19/2023 Surgery   Right mastectomy: DCIS grade 3 with comedonecrosis, margins negative, 0/9 lymph nodes negative, ER 0%, PR 0%     INTERVAL HISTORY:  Stephanie Roth to review her survivorship care plan detailing her treatment course for breast cancer, as well as monitoring long-term side  effects of that treatment, education regarding health maintenance, screening, and overall wellness and health promotion.     Overall, Stephanie Roth reports feeling quite well.  She is waiting for her final reconstructive surgery to be scheduled.  She was supposed to have this last month however came down with COVID and is waiting to get back on the books.  She is doing well and recovering well since her COVID symptoms have resolved.  She denies any new issues today and is feeling overall very well.  REVIEW OF SYSTEMS:  Review of Systems  Constitutional:  Negative for appetite change, chills, fatigue, fever and unexpected weight change.  HENT:   Negative for hearing loss, lump/mass and trouble swallowing.   Eyes:  Negative for eye problems and icterus.  Respiratory:  Negative for chest tightness, cough and shortness of breath.   Cardiovascular:  Negative for chest pain, leg swelling and palpitations.  Gastrointestinal:  Negative for abdominal distention, abdominal pain, constipation, diarrhea, nausea and vomiting.  Endocrine: Negative for hot flashes.  Genitourinary:  Negative for difficulty urinating.   Musculoskeletal:  Negative for arthralgias.  Skin:  Negative for itching and rash.  Neurological:  Negative for dizziness, extremity weakness, headaches and numbness.  Hematological:  Negative for adenopathy. Does not bruise/bleed easily.  Psychiatric/Behavioral:  Negative for depression. The patient is not nervous/anxious.    Breast: Denies any new nodularity, masses, tenderness, nipple changes, or nipple discharge.       PAST MEDICAL/SURGICAL HISTORY:  Past Medical History:  Diagnosis Date   Cancer [facility]) 02/2023   right breast DCIS   DUB (dysfunctional uterine bleeding)    Fibroids    uterine   Goiter 06/16/2007   Hypertension    Hyperthyroidism    Iron deficiency anemia  Joint pain    Menopause    Past Surgical History:  Procedure Laterality Date   blepheroplasty Bilateral     BREAST BIOPSY Right 02/08/2023   MM RT BREAST BX W LOC DEV 1ST LESION IMAGE BX SPEC STEREO GUIDE 02/08/2023 [facility]   BREAST BIOPSY Right 02/08/2023   MM RT BREAST BX W LOC DEV EA AD LESION IMG BX SPEC STEREO GUIDE 02/08/2023 [facility]   BREAST RECONSTRUCTION WITH PLACEMENT OF TISSUE EXPANDER AND FLEX HD (ACELLULAR HYDRATED DERMIS) Right 04/19/2023   Procedure: IMMEDIATE RIGHT BREAST RECONSTRUCTION WITH PLACEMENT OF TISSUE EXPANDER AND FLEX HD (ACELLULAR HYDRATED DERMIS);  Surgeon: Lowery Estefana RAMAN, DO;  Location: [facility] OR;  Service: Plastics;  Laterality: Right;   CESAREAN SECTION     ECTOPIC PREGNANCY SURGERY     GUM SURGERY     MASTECTOMY W/ SENTINEL NODE BIOPSY Right 04/19/2023   Procedure: RIGHT MASTECTOMY AND SENTINEL NODE BIOPSY;  Surgeon: Curvin Mt III, MD;  Location: [facility] OR;  Service: General;  Laterality: Right;  PEC BLOCK   TUBAL LIGATION       ALLERGIES:  Allergies  Allergen Reactions   Monosodium Glutamate Swelling    Facial swelling     CURRENT MEDICATIONS:  Outpatient Encounter Medications as of 07/22/2023  Medication Sig   amLODipine (NORVASC) 2.5 MG tablet Take 2.5 mg by mouth daily.   Cholecalciferol (VITAMIN D3) 2000 units TABS Take 2,000 Units by mouth daily.   fexofenadine (ALLEGRA) 60 MG tablet Take 60 mg by mouth daily.   Flaxseed, Linseed, (FLAXSEED OIL PO) Take 1,300 mg by mouth 2 (two) times daily.   HONEY PO Take 1 each by mouth as needed (When drink tea).   loratadine (CLARITIN) 10 MG tablet Take 10 mg by mouth daily.   Multiple Vitamins-Minerals (WOMENS MULTIVITAMIN PO) Take 1 tablet by mouth daily.   niacin (VITAMIN B3) 500 MG ER tablet Take 500 mg by mouth daily as needed (cholesterol).   polyethylene glycol powder (GLYCOLAX /MIRALAX ) 17 GM/SCOOP powder Take 1 Container by mouth once.   [DISCONTINUED] Multiple Vitamins-Minerals (ICAPS AREDS 2 PO) Take 1 tablet by mouth daily.   Apoaequorin (PREVAGEN PO) Take 1 tablet by mouth every  14 (fourteen) days. (Patient not taking: Reported on 07/22/2023)   BIOTIN PO Take 1 tablet by mouth daily. Hair (Patient not taking: Reported on 07/22/2023)   calcium carbonate (TUMS - DOSED IN MG ELEMENTAL CALCIUM) 500 MG chewable tablet Chew 1 tablet by mouth daily as needed (for calcium). (Patient not taking: Reported on 07/22/2023)   ondansetron  (ZOFRAN ) 4 MG tablet Take 1 tablet (4 mg total) by mouth every 8 (eight) hours as needed for up to 20 doses for nausea or vomiting. (Patient not taking: Reported on 07/22/2023)   oxyCODONE  (ROXICODONE ) 5 MG immediate release tablet Take 1 tablet (5 mg total) by mouth every 6 (six) hours as needed for up to 20 doses for severe pain (pain score 7-10). (Patient not taking: Reported on 07/22/2023)   [DISCONTINUED] diazepam  (VALIUM ) 2 MG tablet Take 1 tablet (2 mg total) by mouth every 12 (twelve) hours as needed for up to 10 doses for muscle spasms.   No facility-administered encounter medications on file as of 07/22/2023.     ONCOLOGIC FAMILY HISTORY:  Family History  Problem Relation Age of Onset   Hypertension Mother    COPD Mother    Congestive Heart Failure Mother    Arthritis Mother    Hypertension Father    Aortic aneurysm Father  Myelodysplastic syndrome Father 49   Psoriasis Sister    Eczema Sister    Arthritis Sister    Cancer Maternal Grandfather    Prostate cancer Maternal Grandfather        mets   Cancer Maternal Uncle 59       unknown type   Cancer Cousin    Colon cancer Cousin 16       paternal female cousin   Breast cancer Cousin 45       paternal female cousin   Cancer Cousin 53       unk type; pat female cousin   Cervical cancer Cousin 50       mat cousin   Cancer Cousin        x5 pat cousins; unknown type; 4 females--one dx 68; one female dx unknown age     SOCIAL HISTORY:  Social History   Socioeconomic History   Marital status: Divorced    Spouse name: Not on file   Number of children: Not on file   Years of education:  Not on file   Highest education level: Not on file  Occupational History   Not on file  Tobacco Use   Smoking status: Never   Smokeless tobacco: Never  Vaping Use   Vaping status: Never Used  Substance and Sexual Activity   Alcohol use: Never   Drug use: Never   Sexual activity: Not Currently    Birth control/protection: Post-menopausal, Surgical    Comment: BTL  Other Topics Concern   Not on file  Social History Narrative   Not on file   Social Drivers of Health   Financial Resource Strain: Not on file  Food Insecurity: No Food Insecurity (04/20/2023)   Hunger Vital Sign    Worried About Running Out of Food in the Last Year: Never true    Ran Out of Food in the Last Year: Never true  Transportation Needs: No Transportation Needs (04/20/2023)   PRAPARE - Administrator, Civil Service (Medical): No    Lack of Transportation (Non-Medical): No  Physical Activity: Not on file  Stress: Not on file  Social Connections: Not on file  Intimate Partner Violence: Not At Risk (04/20/2023)   Humiliation, Afraid, Rape, and Kick questionnaire    Fear of Current or Ex-Partner: No    Emotionally Abused: No    Physically Abused: No    Sexually Abused: No     OBSERVATIONS/OBJECTIVE:  BP 138/74 (BP Location: Left Arm, Patient Position: Sitting)   Pulse 70   Temp (!) 97.4 F (36.3 C) (Tympanic)   Resp 18   Ht 4' 11 (1.499 m)   Wt 154 lb 8 oz (70.1 kg)   SpO2 100%   BMI 31.21 kg/m  GENERAL: Patient is a well appearing female in no acute distress HEENT:  Sclerae anicteric.  Oropharynx clear and moist. No ulcerations or evidence of oropharyngeal candidiasis. Neck is supple.  NODES:  No cervical, supraclavicular, or axillary lymphadenopathy palpated.  BREAST EXAM: Left breast benign, right breast status postmastectomy and reconstruction with expander in place no sign of local recurrence LUNGS:  Clear to auscultation bilaterally.  No wheezes or rhonchi. HEART:  Regular rate  and rhythm. No murmur appreciated. ABDOMEN:  Soft, nontender.  Positive, normoactive bowel sounds. No organomegaly palpated. MSK:  No focal spinal tenderness to palpation. Full range of motion bilaterally in the upper extremities. EXTREMITIES:  No peripheral edema.   SKIN:  Clear with no obvious rashes  or skin changes. No nail dyscrasia. NEURO:  Nonfocal. Well oriented.  Appropriate affect.   LABORATORY DATA:  None for this visit.  DIAGNOSTIC IMAGING:  None for this visit.      ASSESSMENT AND PLAN:  Stephanie Roth is a pleasant 68 y.o. female with Stage 0 right breast DCIS, ER-/PR-, diagnosed in 01/2023, treated with mastectomy. She presents to the Survivorship Clinic for our initial meeting and routine follow-up post-completion of treatment for breast cancer.    1. Stage 0 right breast cancer:  Stephanie Roth is continuing to recover from definitive treatment for breast cancer.   She has not yet had reconstructive surgery and plans to do so on her left breast.  Because of this we need to wait at least 6 months after left breast reduction to complete her mammogram.  She will let me know what her surgery is scheduled so we can order her left breast screening mammogram accordingly.  We will see her in about 7 months for follow-up.  Today, a comprehensive survivorship care plan and treatment summary was reviewed with the patient today detailing her breast cancer diagnosis, treatment course, potential late/long-term effects of treatment, appropriate follow-up care with recommendations for the future, and patient education resources.  A copy of this summary, along with a letter will be sent to the patient's primary care provider via mail/fax/In Basket message after today's visit.    2. Bone health: She was given education on specific activities to promote bone health.  3. Cancer screening:  Due to Stephanie Roth's history and her age, she should receive screening for skin cancers, colon cancer, and gynecologic  cancers.  The information and recommendations are listed on the patient's comprehensive care plan/treatment summary and were reviewed in detail with the patient.    4. Health maintenance and wellness promotion: Stephanie Roth was encouraged to consume 5-7 servings of fruits and vegetables per day. We reviewed the [employer] handout.  She was also encouraged to engage in moderate to vigorous exercise for 30 minutes per day most days of the week.  She was instructed to limit her alcohol consumption and continue to abstain from tobacco use.     5. Support services/counseling: It is not uncommon for this period of the patient's cancer care trajectory to be one of many emotions and stressors.   She was given information regarding our available services and encouraged to contact me with any questions or for help enrolling in any of our support group/programs.    Follow up instructions:    -Return to cancer center in 7 months for f/u  -Mammogram due 6 months following breast reduction -She is welcome to return back to the Survivorship Clinic at any time; no additional follow-up needed at this time.  -Consider referral back to survivorship as a long-term survivor for continued surveillance  The patient was provided an opportunity to ask questions and all were answered. The patient agreed with the plan and demonstrated an understanding of the instructions.   Total encounter time:40 minutes*in face-to-face visit time, chart review, lab review, care coordination, order entry, and documentation of the encounter time.    Stephanie Kendall, Stephanie Roth 07/22/23 11:31 AM Medical Oncology and Hematology [facility] [address] [place], [place] [address] Tel. [phone]    Fax. [phone]  *Total Encounter Time as defined by the Centers for Medicare and Medicaid Services includes, in addition to the face-to-face time of a patient visit (documented in the note above) non-face-to-face time:  obtaining  and reviewing outside history, ordering and reviewing medications, tests or procedures, care coordination (communications with other health care professionals or caregivers) and documentation in the medical record.

## 2023-07-23 ENCOUNTER — Telehealth: Payer: Self-pay | Admitting: Adult Health

## 2023-07-23 NOTE — Telephone Encounter (Signed)
Scheduled appointment per 2/6 los. Patient is aware of the made appointment.

## 2023-08-04 ENCOUNTER — Encounter: Payer: Medicare Other | Admitting: Student

## 2023-08-11 ENCOUNTER — Ambulatory Visit (INDEPENDENT_AMBULATORY_CARE_PROVIDER_SITE_OTHER): Payer: Medicare Other | Admitting: Student

## 2023-08-11 DIAGNOSIS — D0511 Intraductal carcinoma in situ of right breast: Secondary | ICD-10-CM

## 2023-08-11 NOTE — Progress Notes (Signed)
 Patient is a 68 year old female with right breast cancer status post reconstruction.  She was scheduled for removal of her tissue expander and placement of implant to the right breast and left breast reduction on 07/14/2023.  Patient had COVID at the time and her surgery was canceled.  Patient presents to the clinic today to discuss scheduling surgery again.  Today, patient reports she is doing well.  She states that around the week of January 20, she began to not feel well and tested positive for COVID.  She reported that she was treated by her PCP at [facility] with medications.  She states that she is now feeling much better and not having any symptoms.  She reports that she has been back to her normal activities.  She denies any significant changes in health.  She denies any new shortness of breath or chest pain.  Given that patient seems to have recovered from COVID, discussed with her that she should be able to be put back on the schedule.  Patient expressed understanding and states that she would like to move forward with surgery, but requesting after March 15 though.  Patient states that she has her annual exam with her primary care provider tomorrow.  Patient will notify us if she has any changes or recommendations from that appointment.  The patient gave consent to have this visit done by telemedicine / virtual visit, two identifiers were used to identify patient. This is also consent for access the chart and treat the patient via this visit. The patient is located in [place].  I, the provider, am at the office.  We spent 5 minutes together for the visit.  Joined by telephone.

## 2023-08-12 DIAGNOSIS — Z1159 Encounter for screening for other viral diseases: Secondary | ICD-10-CM | POA: Diagnosis not present

## 2023-08-12 DIAGNOSIS — Z853 Personal history of malignant neoplasm of breast: Secondary | ICD-10-CM | POA: Diagnosis not present

## 2023-08-12 DIAGNOSIS — L508 Other urticaria: Secondary | ICD-10-CM | POA: Diagnosis not present

## 2023-08-12 DIAGNOSIS — Z1211 Encounter for screening for malignant neoplasm of colon: Secondary | ICD-10-CM | POA: Diagnosis not present

## 2023-08-12 DIAGNOSIS — Z8616 Personal history of COVID-19: Secondary | ICD-10-CM | POA: Diagnosis not present

## 2023-08-12 DIAGNOSIS — G4733 Obstructive sleep apnea (adult) (pediatric): Secondary | ICD-10-CM | POA: Diagnosis not present

## 2023-08-12 DIAGNOSIS — Z0000 Encounter for general adult medical examination without abnormal findings: Secondary | ICD-10-CM | POA: Diagnosis not present

## 2023-08-12 DIAGNOSIS — I10 Essential (primary) hypertension: Secondary | ICD-10-CM | POA: Diagnosis not present

## 2023-08-12 DIAGNOSIS — E7800 Pure hypercholesterolemia, unspecified: Secondary | ICD-10-CM | POA: Diagnosis not present

## 2023-08-12 DIAGNOSIS — Z23 Encounter for immunization: Secondary | ICD-10-CM | POA: Diagnosis not present

## 2023-08-17 NOTE — Addendum Note (Signed)
 Addended by: Peggye Form on: 08/17/2023 12:36 PM   Modules accepted: Orders

## 2023-08-18 ENCOUNTER — Encounter: Payer: Medicare Other | Admitting: Student

## 2023-08-20 ENCOUNTER — Telehealth: Payer: Self-pay

## 2023-08-20 ENCOUNTER — Ambulatory Visit: Admitting: Physician Assistant

## 2023-08-20 VITALS — BP 150/83 | HR 80

## 2023-08-20 DIAGNOSIS — M546 Pain in thoracic spine: Secondary | ICD-10-CM | POA: Diagnosis not present

## 2023-08-20 DIAGNOSIS — D0511 Intraductal carcinoma in situ of right breast: Secondary | ICD-10-CM

## 2023-08-20 DIAGNOSIS — R9431 Abnormal electrocardiogram [ECG] [EKG]: Secondary | ICD-10-CM | POA: Diagnosis not present

## 2023-08-20 DIAGNOSIS — Z1211 Encounter for screening for malignant neoplasm of colon: Secondary | ICD-10-CM | POA: Diagnosis not present

## 2023-08-20 MED ORDER — OXYCODONE HCL 5 MG PO TABS: 5.0000 mg | ORAL_TABLET | Freq: Four times a day (QID) | ORAL | 0 refills | Status: AC | PRN

## 2023-08-20 MED ORDER — CEPHALEXIN 500 MG PO CAPS: 500.0000 mg | ORAL_CAPSULE | Freq: Four times a day (QID) | ORAL | 0 refills | Status: AC

## 2023-08-20 MED ORDER — ONDANSETRON 4 MG PO TBDP: 4.0000 mg | ORAL_TABLET | Freq: Three times a day (TID) | ORAL | 0 refills | Status: AC | PRN

## 2023-08-20 NOTE — Progress Notes (Signed)
 Patient ID: Stephanie Roth, female    DOB: [birth year], 68 y.o.   MRN: [id number]  Chief Complaint  Patient presents with   Pre-op Exam      ICD-10-CM   1. Ductal carcinoma in situ (DCIS) of right breast  D05.11        History of Present Illness: Stephanie Roth is a 68 y.o.  female  with a history of right breast DCIS s/p unilateral mastectomy with immediate prepectoral reconstruction using tissue expander 04/09/2023 by Dr. Ulice Bold and Dr. Carolynne Edouard.  She presents for preoperative evaluation for upcoming procedure, removal of right breast tissue expander with placement of breast implant and contralateral oncoplastic reduction for symmetry, scheduled for 09/16/2023 with Dr. Ulice Bold.  The patient had a previous complication with anesthesia in 2020 after colonoscopy at which time she reports that she stopped breathing and required intubation and was ultimately told that she may have undiagnosed OSA.  She has been under anesthesia since then without any complication.  Patient states that she did have a sleep study and does not require CPAP.  She endorses the history of right breast DCIS, but no other personal history of cancer.  She also denies any personal or family history of blood clots or clotting disorder.  She denies any significant history of cardiac or pulmonary disease, varicosities, nicotine use, or need for anticoagulation.  She confirms that she is a G cup and would like to be as small as safely possible, preferably in the D/C cup range, postoperatively.  Discussed limitations with how much tissue can be safely excised without compromising shape of breast and nipple viability.  She understands the possibility for postoperative asymmetry.  She does tell me that she recently saw her PCP and an echocardiogram was ordered due to new murmur.  She then proceeds to tell me that she has had a 5-day history of atraumatic left shoulder discomfort with radiation down upper arm.  Not exacerbated by  certain movements.  She is largely asymptomatic on exam today and is denying any chest pain or difficulty breathing, but will still send clearance to primary care provider.  She plans to head there for evaluation and management.  Summary of Previous Visit: She was last seen here in clinic on 08/11/2023.  At that time, she was feeling recovered from her bout of COVID that had led to cancellation of previously scheduled surgery.  Patient elected for delayed phase to surgery.  She had previously expressed that she would like to be around a C cup after her reconstruction.  Expander volume equals 600/535 cc.  Job: Insurance, planning 1 week off FMLA/STD.  PMH Significant for: Right breast DCIS, HTN.   Past Medical History: Allergies: Allergies  Allergen Reactions   Monosodium Glutamate Swelling    Facial swelling    Current Medications:  Current Outpatient Medications:    amLODipine (NORVASC) 2.5 MG tablet, Take 2.5 mg by mouth daily., Disp: , Rfl:    cephALEXin (KEFLEX) 500 MG capsule, Take 1 capsule (500 mg total) by mouth 4 (four) times daily for 3 days., Disp: 12 capsule, Rfl: 0   Cholecalciferol (VITAMIN D3) 2000 units TABS, Take 2,000 Units by mouth daily., Disp: , Rfl:    fexofenadine (ALLEGRA) 60 MG tablet, Take 60 mg by mouth daily., Disp: , Rfl:    Flaxseed, Linseed, (FLAXSEED OIL PO), Take 1,300 mg by mouth 2 (two) times daily., Disp: , Rfl:    HONEY PO, Take 1 each by mouth as  needed (When drink tea)., Disp: , Rfl:    loratadine (CLARITIN) 10 MG tablet, Take 10 mg by mouth daily., Disp: , Rfl:    Multiple Vitamins-Minerals (WOMENS MULTIVITAMIN PO), Take 1 tablet by mouth daily., Disp: , Rfl:    niacin (VITAMIN B3) 500 MG ER tablet, Take 500 mg by mouth daily as needed (cholesterol)., Disp: , Rfl:    ondansetron (ZOFRAN-ODT) 4 MG disintegrating tablet, Take 1 tablet (4 mg total) by mouth every 8 (eight) hours as needed for nausea or vomiting., Disp: 20 tablet, Rfl: 0   oxyCODONE  (ROXICODONE) 5 MG immediate release tablet, Take 1 tablet (5 mg total) by mouth every 6 (six) hours as needed for up to 5 days for severe pain (pain score 7-10)., Disp: 20 tablet, Rfl: 0   polyethylene glycol powder (GLYCOLAX/MIRALAX) 17 GM/SCOOP powder, Take 1 Container by mouth once., Disp: , Rfl:    Apoaequorin (PREVAGEN PO), Take 1 tablet by mouth every 14 (fourteen) days. (Patient not taking: Reported on 07/22/2023), Disp: , Rfl:    BIOTIN PO, Take 1 tablet by mouth daily. Hair (Patient not taking: Reported on 07/22/2023), Disp: , Rfl:    calcium carbonate (TUMS - DOSED IN MG ELEMENTAL CALCIUM) 500 MG chewable tablet, Chew 1 tablet by mouth daily as needed (for calcium). (Patient not taking: Reported on 07/22/2023), Disp: , Rfl:   Past Medical Problems: Past Medical History:  Diagnosis Date   Cancer (HCC) 02/2023   right breast DCIS   DUB (dysfunctional uterine bleeding)    Fibroids    uterine   Goiter 06/16/2007   Hypertension    Hyperthyroidism    Iron deficiency anemia    Joint pain    Menopause     Past Surgical History: Past Surgical History:  Procedure Laterality Date   blepheroplasty Bilateral    BREAST BIOPSY Right 02/08/2023   MM RT BREAST BX W LOC DEV 1ST LESION IMAGE BX SPEC STEREO GUIDE 02/08/2023 [facility]   BREAST BIOPSY Right 02/08/2023   MM RT BREAST BX W LOC DEV EA AD LESION IMG BX SPEC STEREO GUIDE 02/08/2023 [facility]   BREAST RECONSTRUCTION WITH PLACEMENT OF TISSUE EXPANDER AND FLEX HD (ACELLULAR HYDRATED DERMIS) Right 04/19/2023   Procedure: IMMEDIATE RIGHT BREAST RECONSTRUCTION WITH PLACEMENT OF TISSUE EXPANDER AND FLEX HD (ACELLULAR HYDRATED DERMIS);  Surgeon: Peggye Form, DO;  Location: [facility];  Service: Plastics;  Laterality: Right;   CESAREAN SECTION     ECTOPIC PREGNANCY SURGERY     GUM SURGERY     MASTECTOMY W/ SENTINEL NODE BIOPSY Right 04/19/2023   Procedure: RIGHT MASTECTOMY AND SENTINEL NODE BIOPSY;  Surgeon: Griselda Miner, MD;   Location: [facility];  Service: General;  Laterality: Right;  PEC BLOCK   TUBAL LIGATION      Social History: Social History   Socioeconomic History   Marital status: Divorced    Spouse name: Not on file   Number of children: Not on file   Years of education: Not on file   Highest education level: Not on file  Occupational History   Not on file  Tobacco Use   Smoking status: Never   Smokeless tobacco: Never  Vaping Use   Vaping status: Never Used  Substance and Sexual Activity   Alcohol use: Never   Drug use: Never   Sexual activity: Not Currently    Birth control/protection: Post-menopausal, Surgical    Comment: BTL  Other Topics Concern   Not on file  Social  History Narrative   Not on file   Social Drivers of Health   Financial Resource Strain: Not on file  Food Insecurity: No Food Insecurity (04/20/2023)   Hunger Vital Sign    Worried About Running Out of Food in the Last Year: Never true    Ran Out of Food in the Last Year: Never true  Transportation Needs: No Transportation Needs (04/20/2023)   PRAPARE - Administrator, Civil Service (Medical): No    Lack of Transportation (Non-Medical): No  Physical Activity: Not on file  Stress: Not on file  Social Connections: Not on file  Intimate Partner Violence: Not At Risk (04/20/2023)   Humiliation, Afraid, Rape, and Kick questionnaire    Fear of Current or Ex-Partner: No    Emotionally Abused: No    Physically Abused: No    Sexually Abused: No    Family History: Family History  Problem Relation Age of Onset   Hypertension Mother    COPD Mother    Congestive Heart Failure Mother    Arthritis Mother    Hypertension Father    Aortic aneurysm Father    Myelodysplastic syndrome Father 38   Psoriasis Sister    Eczema Sister    Arthritis Sister    Cancer Maternal Grandfather    Prostate cancer Maternal Grandfather        mets   Cancer Maternal Uncle 60       unknown type   Cancer Cousin    Colon  cancer Cousin 16       paternal female cousin   Breast cancer Cousin 54       paternal female cousin   Cancer Cousin 64       unk type; pat female cousin   Cervical cancer Cousin 50       mat cousin   Cancer Cousin        x5 pat cousins; unknown type; 4 females--one dx 26; one female dx unknown age    Review of Systems: ROS Well recovered from COVID.  Denies any chest pain, fevers, difficulty breathing, leg swelling.  Physical Exam: Vital Signs BP (!) 150/83 (BP Location: Left Arm, Patient Position: Sitting, Cuff Size: Normal)   Pulse 80   SpO2 98%   Physical Exam Constitutional:      General: Not in acute distress.    Appearance: Normal appearance. Not ill-appearing.  HENT:     Head: Normocephalic and atraumatic.  Eyes:     Pupils: Pupils are equal, round. Cardiovascular:     Rate and Rhythm: Normal rate.    Pulses: Normal pulses.  Pulmonary:     Effort: No respiratory distress or increased work of breathing.  Speaks in full sentences. Abdominal:     General: Abdomen is flat. No distension.   Musculoskeletal: Normal range of motion. No lower extremity swelling or edema. No varicosities. Skin:    General: Skin is warm and dry.     Findings: No erythema or rash.  Neurological:     Mental Status: Alert and oriented to person, place, and time.  Psychiatric:        Mood and Affect: Mood normal.        Behavior: Behavior normal.    Assessment/Plan: The patient is scheduled for removal of right breast tissue expander with placement of breast implant and contralateral oncoplastic reduction for symmetry with Dr. Ulice Bold.  Risks, benefits, and alternatives of procedure discussed, questions answered and consent obtained.    Smoking Status:  Non-smoker.  Last Mammogram: 01/2023; Results: Left breast is negative.  Right breast ultimately DCIS now s/p mastectomy.  Caprini Score: 7; Risk Factors include: Age, BMI greater than 25, history of malignancy, and length of planned  surgery. Recommendation for mechanical and possibly pharmacological prophylaxis.  Will discuss with surgeon and prescribe Lovenox if indicated, but either way encourage early ambulation.   Pictures obtained: 06/15/2023  Post-op Rx sent to pharmacy: Oxycodone, Zofran, and Keflex.  Patient was provided with the General Surgical Risk consent document and Pain Medication Agreement prior to their appointment.  They had adequate time to read through the risk consent documents and Pain Medication Agreement. We also discussed them in person together during this preop appointment. All of their questions were answered to their satisfaction.  Recommended calling if they have any further questions.  Risk consent form and Pain Medication Agreement to be scanned into patient's chart.  The risks that can be encountered with and after placement of a breast implant placement were discussed and include the following but not limited to these: bleeding, infection, delayed healing, anesthesia risks, skin sensation changes, injury to structures including nerves, blood vessels, and muscles which may be temporary or permanent, allergies to tape, suture materials and glues, blood products, topical preparations or injected agents, skin contour irregularities, skin discoloration and swelling, deep vein thrombosis, cardiac and pulmonary complications, pain, which may persist, fluid accumulation, wrinkling of the skin over the implant, changes in nipple or breast sensation, implant leakage or rupture, faulty position of the implant, persistent pain, formation of tight scar tissue around the implant (capsular contracture), possible need for revisional surgery or staged procedures.   Electronically signed by: Evelena Leyden, PA-C 08/20/2023 10:19 AM

## 2023-08-20 NOTE — H&P (View-Only) (Signed)
 Patient ID: Stephanie Roth, female    DOB: [birth year], 68 y.o.   MRN: [id number]  Chief Complaint  Patient presents with   Pre-op Exam      ICD-10-CM   1. Ductal carcinoma in situ (DCIS) of right breast  D05.11        History of Present Illness: Stephanie Roth is a 68 y.o.  female  with a history of right breast DCIS s/p unilateral mastectomy with immediate prepectoral reconstruction using tissue expander 04/09/2023 by Dr. Ulice Bold and Dr. Carolynne Edouard.  She presents for preoperative evaluation for upcoming procedure, removal of right breast tissue expander with placement of breast implant and contralateral oncoplastic reduction for symmetry, scheduled for 09/16/2023 with Dr. Ulice Bold.  The patient had a previous complication with anesthesia in 2020 after colonoscopy at which time she reports that she stopped breathing and required intubation and was ultimately told that she may have undiagnosed OSA.  She has been under anesthesia since then without any complication.  Patient states that she did have a sleep study and does not require CPAP.  She endorses the history of right breast DCIS, but no other personal history of cancer.  She also denies any personal or family history of blood clots or clotting disorder.  She denies any significant history of cardiac or pulmonary disease, varicosities, nicotine use, or need for anticoagulation.  She confirms that she is a G cup and would like to be as small as safely possible, preferably in the D/C cup range, postoperatively.  Discussed limitations with how much tissue can be safely excised without compromising shape of breast and nipple viability.  She understands the possibility for postoperative asymmetry.  She does tell me that she recently saw her PCP and an echocardiogram was ordered due to new murmur.  She then proceeds to tell me that she has had a 5-day history of atraumatic left shoulder discomfort with radiation down upper arm.  Not exacerbated by  certain movements.  She is largely asymptomatic on exam today and is denying any chest pain or difficulty breathing, but will still send clearance to primary care provider.  She plans to head there for evaluation and management.  Summary of Previous Visit: She was last seen here in clinic on 08/11/2023.  At that time, she was feeling recovered from her bout of COVID that had led to cancellation of previously scheduled surgery.  Patient elected for delayed phase to surgery.  She had previously expressed that she would like to be around a C cup after her reconstruction.  Expander volume equals 600/535 cc.  Job: Insurance, planning 1 week off FMLA/STD.  PMH Significant for: Right breast DCIS, HTN.   Past Medical History: Allergies: Allergies  Allergen Reactions   Monosodium Glutamate Swelling    Facial swelling    Current Medications:  Current Outpatient Medications:    amLODipine (NORVASC) 2.5 MG tablet, Take 2.5 mg by mouth daily., Disp: , Rfl:    cephALEXin (KEFLEX) 500 MG capsule, Take 1 capsule (500 mg total) by mouth 4 (four) times daily for 3 days., Disp: 12 capsule, Rfl: 0   Cholecalciferol (VITAMIN D3) 2000 units TABS, Take 2,000 Units by mouth daily., Disp: , Rfl:    fexofenadine (ALLEGRA) 60 MG tablet, Take 60 mg by mouth daily., Disp: , Rfl:    Flaxseed, Linseed, (FLAXSEED OIL PO), Take 1,300 mg by mouth 2 (two) times daily., Disp: , Rfl:    HONEY PO, Take 1 each by mouth as  needed (When drink tea)., Disp: , Rfl:    loratadine (CLARITIN) 10 MG tablet, Take 10 mg by mouth daily., Disp: , Rfl:    Multiple Vitamins-Minerals (WOMENS MULTIVITAMIN PO), Take 1 tablet by mouth daily., Disp: , Rfl:    niacin (VITAMIN B3) 500 MG ER tablet, Take 500 mg by mouth daily as needed (cholesterol)., Disp: , Rfl:    ondansetron (ZOFRAN-ODT) 4 MG disintegrating tablet, Take 1 tablet (4 mg total) by mouth every 8 (eight) hours as needed for nausea or vomiting., Disp: 20 tablet, Rfl: 0   oxyCODONE  (ROXICODONE) 5 MG immediate release tablet, Take 1 tablet (5 mg total) by mouth every 6 (six) hours as needed for up to 5 days for severe pain (pain score 7-10)., Disp: 20 tablet, Rfl: 0   polyethylene glycol powder (GLYCOLAX/MIRALAX) 17 GM/SCOOP powder, Take 1 Container by mouth once., Disp: , Rfl:    Apoaequorin (PREVAGEN PO), Take 1 tablet by mouth every 14 (fourteen) days. (Patient not taking: Reported on 07/22/2023), Disp: , Rfl:    BIOTIN PO, Take 1 tablet by mouth daily. Hair (Patient not taking: Reported on 07/22/2023), Disp: , Rfl:    calcium carbonate (TUMS - DOSED IN MG ELEMENTAL CALCIUM) 500 MG chewable tablet, Chew 1 tablet by mouth daily as needed (for calcium). (Patient not taking: Reported on 07/22/2023), Disp: , Rfl:   Past Medical Problems: Past Medical History:  Diagnosis Date   Cancer (HCC) 02/2023   right breast DCIS   DUB (dysfunctional uterine bleeding)    Fibroids    uterine   Goiter 06/16/2007   Hypertension    Hyperthyroidism    Iron deficiency anemia    Joint pain    Menopause     Past Surgical History: Past Surgical History:  Procedure Laterality Date   blepheroplasty Bilateral    BREAST BIOPSY Right 02/08/2023   MM RT BREAST BX W LOC DEV 1ST LESION IMAGE BX SPEC STEREO GUIDE 02/08/2023 [facility]   BREAST BIOPSY Right 02/08/2023   MM RT BREAST BX W LOC DEV EA AD LESION IMG BX SPEC STEREO GUIDE 02/08/2023 [facility]   BREAST RECONSTRUCTION WITH PLACEMENT OF TISSUE EXPANDER AND FLEX HD (ACELLULAR HYDRATED DERMIS) Right 04/19/2023   Procedure: IMMEDIATE RIGHT BREAST RECONSTRUCTION WITH PLACEMENT OF TISSUE EXPANDER AND FLEX HD (ACELLULAR HYDRATED DERMIS);  Surgeon: Peggye Form, DO;  Location: [facility];  Service: Plastics;  Laterality: Right;   CESAREAN SECTION     ECTOPIC PREGNANCY SURGERY     GUM SURGERY     MASTECTOMY W/ SENTINEL NODE BIOPSY Right 04/19/2023   Procedure: RIGHT MASTECTOMY AND SENTINEL NODE BIOPSY;  Surgeon: Griselda Miner, MD;   Location: [facility];  Service: General;  Laterality: Right;  PEC BLOCK   TUBAL LIGATION      Social History: Social History   Socioeconomic History   Marital status: Divorced    Spouse name: Not on file   Number of children: Not on file   Years of education: Not on file   Highest education level: Not on file  Occupational History   Not on file  Tobacco Use   Smoking status: Never   Smokeless tobacco: Never  Vaping Use   Vaping status: Never Used  Substance and Sexual Activity   Alcohol use: Never   Drug use: Never   Sexual activity: Not Currently    Birth control/protection: Post-menopausal, Surgical    Comment: BTL  Other Topics Concern   Not on file  Social  History Narrative   Not on file   Social Drivers of Health   Financial Resource Strain: Not on file  Food Insecurity: No Food Insecurity (04/20/2023)   Hunger Vital Sign    Worried About Running Out of Food in the Last Year: Never true    Ran Out of Food in the Last Year: Never true  Transportation Needs: No Transportation Needs (04/20/2023)   PRAPARE - Administrator, Civil Service (Medical): No    Lack of Transportation (Non-Medical): No  Physical Activity: Not on file  Stress: Not on file  Social Connections: Not on file  Intimate Partner Violence: Not At Risk (04/20/2023)   Humiliation, Afraid, Rape, and Kick questionnaire    Fear of Current or Ex-Partner: No    Emotionally Abused: No    Physically Abused: No    Sexually Abused: No    Family History: Family History  Problem Relation Age of Onset   Hypertension Mother    COPD Mother    Congestive Heart Failure Mother    Arthritis Mother    Hypertension Father    Aortic aneurysm Father    Myelodysplastic syndrome Father 38   Psoriasis Sister    Eczema Sister    Arthritis Sister    Cancer Maternal Grandfather    Prostate cancer Maternal Grandfather        mets   Cancer Maternal Uncle 60       unknown type   Cancer Cousin    Colon  cancer Cousin 16       paternal female cousin   Breast cancer Cousin 54       paternal female cousin   Cancer Cousin 64       unk type; pat female cousin   Cervical cancer Cousin 50       mat cousin   Cancer Cousin        x5 pat cousins; unknown type; 4 females--one dx 26; one female dx unknown age    Review of Systems: ROS Well recovered from COVID.  Denies any chest pain, fevers, difficulty breathing, leg swelling.  Physical Exam: Vital Signs BP (!) 150/83 (BP Location: Left Arm, Patient Position: Sitting, Cuff Size: Normal)   Pulse 80   SpO2 98%   Physical Exam Constitutional:      General: Not in acute distress.    Appearance: Normal appearance. Not ill-appearing.  HENT:     Head: Normocephalic and atraumatic.  Eyes:     Pupils: Pupils are equal, round. Cardiovascular:     Rate and Rhythm: Normal rate.    Pulses: Normal pulses.  Pulmonary:     Effort: No respiratory distress or increased work of breathing.  Speaks in full sentences. Abdominal:     General: Abdomen is flat. No distension.   Musculoskeletal: Normal range of motion. No lower extremity swelling or edema. No varicosities. Skin:    General: Skin is warm and dry.     Findings: No erythema or rash.  Neurological:     Mental Status: Alert and oriented to person, place, and time.  Psychiatric:        Mood and Affect: Mood normal.        Behavior: Behavior normal.    Assessment/Plan: The patient is scheduled for removal of right breast tissue expander with placement of breast implant and contralateral oncoplastic reduction for symmetry with Dr. Ulice Bold.  Risks, benefits, and alternatives of procedure discussed, questions answered and consent obtained.    Smoking Status:  Non-smoker.  Last Mammogram: 01/2023; Results: Left breast is negative.  Right breast ultimately DCIS now s/p mastectomy.  Caprini Score: 7; Risk Factors include: Age, BMI greater than 25, history of malignancy, and length of planned  surgery. Recommendation for mechanical and possibly pharmacological prophylaxis.  Will discuss with surgeon and prescribe Lovenox if indicated, but either way encourage early ambulation.   Pictures obtained: 06/15/2023  Post-op Rx sent to pharmacy: Oxycodone, Zofran, and Keflex.  Patient was provided with the General Surgical Risk consent document and Pain Medication Agreement prior to their appointment.  They had adequate time to read through the risk consent documents and Pain Medication Agreement. We also discussed them in person together during this preop appointment. All of their questions were answered to their satisfaction.  Recommended calling if they have any further questions.  Risk consent form and Pain Medication Agreement to be scanned into patient's chart.  The risks that can be encountered with and after placement of a breast implant placement were discussed and include the following but not limited to these: bleeding, infection, delayed healing, anesthesia risks, skin sensation changes, injury to structures including nerves, blood vessels, and muscles which may be temporary or permanent, allergies to tape, suture materials and glues, blood products, topical preparations or injected agents, skin contour irregularities, skin discoloration and swelling, deep vein thrombosis, cardiac and pulmonary complications, pain, which may persist, fluid accumulation, wrinkling of the skin over the implant, changes in nipple or breast sensation, implant leakage or rupture, faulty position of the implant, persistent pain, formation of tight scar tissue around the implant (capsular contracture), possible need for revisional surgery or staged procedures.   Electronically signed by: Evelena Leyden, PA-C 08/20/2023 10:19 AM

## 2023-08-20 NOTE — Telephone Encounter (Signed)
 Faxed Request for Surgical Clearance to patient's PCP with confirmed receipt.  Dr. [facility] Phone: [phone] Fax: [phone]

## 2023-08-23 ENCOUNTER — Telehealth: Payer: Self-pay | Admitting: Student

## 2023-08-23 ENCOUNTER — Encounter: Payer: Self-pay | Admitting: Student

## 2023-08-23 NOTE — Progress Notes (Signed)
 I spoke with the patient and we discussed silicone and saline implants.  She is okay to move forward with silicone implants.

## 2023-08-23 NOTE — Telephone Encounter (Signed)
Patient said she was returning your call. 

## 2023-08-23 NOTE — H&P (View-Only) (Signed)
 I spoke with the patient and we discussed silicone and saline implants.  She is okay to move forward with silicone implants.

## 2023-08-25 DIAGNOSIS — Z9011 Acquired absence of right breast and nipple: Secondary | ICD-10-CM | POA: Diagnosis not present

## 2023-08-25 DIAGNOSIS — M25512 Pain in left shoulder: Secondary | ICD-10-CM | POA: Diagnosis not present

## 2023-09-08 ENCOUNTER — Encounter (HOSPITAL_BASED_OUTPATIENT_CLINIC_OR_DEPARTMENT_OTHER): Payer: Self-pay | Admitting: Plastic Surgery

## 2023-09-09 DIAGNOSIS — M5384 Other specified dorsopathies, thoracic region: Secondary | ICD-10-CM | POA: Diagnosis not present

## 2023-09-09 DIAGNOSIS — M50123 Cervical disc disorder at C6-C7 level with radiculopathy: Secondary | ICD-10-CM | POA: Diagnosis not present

## 2023-09-09 DIAGNOSIS — M9901 Segmental and somatic dysfunction of cervical region: Secondary | ICD-10-CM | POA: Diagnosis not present

## 2023-09-09 DIAGNOSIS — M9902 Segmental and somatic dysfunction of thoracic region: Secondary | ICD-10-CM | POA: Diagnosis not present

## 2023-09-10 ENCOUNTER — Encounter: Admitting: Plastic Surgery

## 2023-09-13 DIAGNOSIS — M47812 Spondylosis without myelopathy or radiculopathy, cervical region: Secondary | ICD-10-CM | POA: Diagnosis not present

## 2023-09-14 ENCOUNTER — Other Ambulatory Visit (HOSPITAL_COMMUNITY): Payer: Self-pay | Admitting: Family Medicine

## 2023-09-14 DIAGNOSIS — R011 Cardiac murmur, unspecified: Secondary | ICD-10-CM

## 2023-09-15 NOTE — Anesthesia Preprocedure Evaluation (Addendum)
 Anesthesia Evaluation  Patient identified by MRN, date of birth, ID band Patient awake    Reviewed: Allergy & Precautions, H&P , NPO status , Patient's Chart, lab work & pertinent test results  Airway Mallampati: II  TM Distance: >3 FB Neck ROM: Full    Dental no notable dental hx. (+) Dental Advisory Given, Teeth Intact   Pulmonary neg pulmonary ROS, neg recent URI   Pulmonary exam normal breath sounds clear to auscultation       Cardiovascular hypertension, Pt. on medications Normal cardiovascular exam Rhythm:Regular Rate:Normal     Neuro/Psych negative neurological ROS  negative psych ROS   GI/Hepatic negative GI ROS, Neg liver ROS,,,  Endo/Other   Hyperthyroidism   Renal/GU negative Renal ROS     Musculoskeletal negative musculoskeletal ROS (+)    Abdominal  (+) + obese  Peds  Hematology  (+) Blood dyscrasia, anemia   Anesthesia Other Findings DCIS of right breast  Reproductive/Obstetrics negative OB ROS                             Anesthesia Physical Anesthesia Plan  ASA: 2  Anesthesia Plan: General   Post-op Pain Management: Tylenol PO (pre-op)* and Gabapentin PO (pre-op)*   Induction: Intravenous  PONV Risk Score and Plan: 4 or greater and Ondansetron, Dexamethasone, Treatment may vary due to age or medical condition, Midazolam, Propofol infusion and TIVA  Airway Management Planned: Oral ETT  Additional Equipment:   Intra-op Plan:   Post-operative Plan: Extubation in OR  Informed Consent: I have reviewed the patients History and Physical, chart, labs and discussed the procedure including the risks, benefits and alternatives for the proposed anesthesia with the patient or authorized representative who has indicated his/her understanding and acceptance.     Dental advisory given  Plan Discussed with: CRNA  Anesthesia Plan Comments:         Anesthesia Quick  Evaluation

## 2023-09-16 ENCOUNTER — Other Ambulatory Visit: Payer: Self-pay

## 2023-09-16 ENCOUNTER — Encounter (HOSPITAL_BASED_OUTPATIENT_CLINIC_OR_DEPARTMENT_OTHER): Payer: Self-pay | Admitting: Plastic Surgery

## 2023-09-16 ENCOUNTER — Ambulatory Visit (HOSPITAL_BASED_OUTPATIENT_CLINIC_OR_DEPARTMENT_OTHER)
Admission: RE | Admit: 2023-09-16 | Discharge: 2023-09-16 | Disposition: A | Discharge status: Home / Self Care | Attending: Plastic Surgery | Admitting: Plastic Surgery

## 2023-09-16 ENCOUNTER — Ambulatory Visit (HOSPITAL_BASED_OUTPATIENT_CLINIC_OR_DEPARTMENT_OTHER): Admitting: Anesthesiology

## 2023-09-16 ENCOUNTER — Encounter (HOSPITAL_BASED_OUTPATIENT_CLINIC_OR_DEPARTMENT_OTHER)
Admission: RE | Disposition: A | Discharge status: Home / Self Care | Payer: Self-pay | Source: Home / Self Care | Attending: Plastic Surgery

## 2023-09-16 DIAGNOSIS — Z853 Personal history of malignant neoplasm of breast: Secondary | ICD-10-CM

## 2023-09-16 DIAGNOSIS — Z79899 Other long term (current) drug therapy: Secondary | ICD-10-CM | POA: Insufficient documentation

## 2023-09-16 DIAGNOSIS — Z9011 Acquired absence of right breast and nipple: Secondary | ICD-10-CM | POA: Insufficient documentation

## 2023-09-16 DIAGNOSIS — I10 Essential (primary) hypertension: Secondary | ICD-10-CM | POA: Insufficient documentation

## 2023-09-16 DIAGNOSIS — Z421 Encounter for breast reconstruction following mastectomy: Secondary | ICD-10-CM | POA: Insufficient documentation

## 2023-09-16 DIAGNOSIS — Z8616 Personal history of COVID-19: Secondary | ICD-10-CM | POA: Insufficient documentation

## 2023-09-16 DIAGNOSIS — Z86000 Personal history of in-situ neoplasm of breast: Secondary | ICD-10-CM | POA: Diagnosis not present

## 2023-09-16 DIAGNOSIS — N651 Disproportion of reconstructed breast: Secondary | ICD-10-CM

## 2023-09-16 DIAGNOSIS — N6489 Other specified disorders of breast: Secondary | ICD-10-CM | POA: Insufficient documentation

## 2023-09-16 DIAGNOSIS — Z01818 Encounter for other preprocedural examination: Secondary | ICD-10-CM

## 2023-09-16 HISTORY — PX: BREAST REDUCTION SURGERY: SHX8

## 2023-09-16 HISTORY — PX: REMOVAL OF TISSUE EXPANDER AND PLACEMENT OF IMPLANT: SHX6457

## 2023-09-16 SURGERY — REMOVAL, TISSUE EXPANDER, BREAST, WITH IMPLANT INSERTION
Anesthesia: General | Site: Chest | Laterality: Right

## 2023-09-16 MED ORDER — BUPIVACAINE LIPOSOME 1.3 % IJ SUSP
INTRAMUSCULAR | Status: DC | PRN
  Administered 2023-09-16: 20 mL

## 2023-09-16 MED ORDER — ONDANSETRON HCL 4 MG/2ML IJ SOLN
INTRAMUSCULAR | Status: DC | PRN
  Administered 2023-09-16: 4 mg via INTRAVENOUS

## 2023-09-16 MED ORDER — FENTANYL CITRATE (PF) 100 MCG/2ML IJ SOLN
INTRAMUSCULAR | Status: AC
  Filled 2023-09-16: qty 2

## 2023-09-16 MED ORDER — LIDOCAINE 2% (20 MG/ML) 5 ML SYRINGE
INTRAMUSCULAR | Status: AC
  Filled 2023-09-16: qty 5

## 2023-09-16 MED ORDER — PHENYLEPHRINE 80 MCG/ML (10ML) SYRINGE FOR IV PUSH (FOR BLOOD PRESSURE SUPPORT)
PREFILLED_SYRINGE | INTRAVENOUS | Status: DC | PRN
Start: 2023-09-16 — End: 2023-09-16
  Administered 2023-09-16: 160 ug via INTRAVENOUS
  Administered 2023-09-16 (×2): 80 ug via INTRAVENOUS

## 2023-09-16 MED ORDER — ACETAMINOPHEN 500 MG PO TABS
ORAL_TABLET | ORAL | Status: AC
  Filled 2023-09-16: qty 2

## 2023-09-16 MED ORDER — OXYCODONE HCL 5 MG/5ML PO SOLN: 5.0000 mg | Freq: Once | ORAL | Status: DC | PRN

## 2023-09-16 MED ORDER — VASHE WOUND IRRIGATION OPTIME
TOPICAL | Status: DC | PRN
  Administered 2023-09-16: 34 [oz_av]

## 2023-09-16 MED ORDER — FENTANYL CITRATE (PF) 100 MCG/2ML IJ SOLN
INTRAMUSCULAR | Status: DC | PRN
  Administered 2023-09-16: 50 ug via INTRAVENOUS

## 2023-09-16 MED ORDER — HYDROMORPHONE HCL 1 MG/ML IJ SOLN: 0.2500 mg | INTRAMUSCULAR | Status: DC | PRN

## 2023-09-16 MED ORDER — ROCURONIUM BROMIDE 10 MG/ML (PF) SYRINGE
PREFILLED_SYRINGE | INTRAVENOUS | Status: DC | PRN
  Administered 2023-09-16: 60 mg via INTRAVENOUS

## 2023-09-16 MED ORDER — OXYCODONE HCL 5 MG PO TABS: 5.0000 mg | ORAL_TABLET | Freq: Once | ORAL | Status: DC | PRN

## 2023-09-16 MED ORDER — ACETAMINOPHEN 500 MG PO TABS
1000.0000 mg | ORAL_TABLET | Freq: Once | ORAL | Status: AC
  Administered 2023-09-16: 1000 mg via ORAL

## 2023-09-16 MED ORDER — LIDOCAINE 2% (20 MG/ML) 5 ML SYRINGE
INTRAMUSCULAR | Status: DC | PRN
Start: 2023-09-16 — End: 2023-09-16
  Administered 2023-09-16: 70 mg via INTRAVENOUS

## 2023-09-16 MED ORDER — BUPIVACAINE LIPOSOME 1.3 % IJ SUSP
INTRAMUSCULAR | Status: AC
  Filled 2023-09-16: qty 20

## 2023-09-16 MED ORDER — DROPERIDOL 2.5 MG/ML IJ SOLN: 0.6250 mg | Freq: Once | INTRAMUSCULAR | Status: DC | PRN

## 2023-09-16 MED ORDER — ONDANSETRON HCL 4 MG/2ML IJ SOLN
INTRAMUSCULAR | Status: AC
  Filled 2023-09-16: qty 2

## 2023-09-16 MED ORDER — PROPOFOL 10 MG/ML IV BOLUS
INTRAVENOUS | Status: AC
  Filled 2023-09-16: qty 20

## 2023-09-16 MED ORDER — EPHEDRINE 5 MG/ML INJ
INTRAVENOUS | Status: AC
  Filled 2023-09-16: qty 5

## 2023-09-16 MED ORDER — SODIUM CHLORIDE (PF) 0.9 % IJ SOLN
INTRAMUSCULAR | Status: AC
  Filled 2023-09-16: qty 20

## 2023-09-16 MED ORDER — LIDOCAINE HCL 1 % IJ SOLN
INTRAVENOUS | Status: DC | PRN
  Administered 2023-09-16: 300 mL

## 2023-09-16 MED ORDER — EPINEPHRINE PF 1 MG/ML IJ SOLN
INTRAMUSCULAR | Status: AC
  Filled 2023-09-16: qty 1

## 2023-09-16 MED ORDER — LIDOCAINE HCL (PF) 1 % IJ SOLN
INTRAMUSCULAR | Status: AC
  Filled 2023-09-16: qty 60

## 2023-09-16 MED ORDER — LIDOCAINE-EPINEPHRINE 1 %-1:100000 IJ SOLN
INTRAMUSCULAR | Status: AC
  Filled 2023-09-16: qty 1

## 2023-09-16 MED ORDER — DEXAMETHASONE SODIUM PHOSPHATE 10 MG/ML IJ SOLN
INTRAMUSCULAR | Status: DC | PRN
  Administered 2023-09-16: 5 mg via INTRAVENOUS

## 2023-09-16 MED ORDER — MIDAZOLAM HCL 2 MG/2ML IJ SOLN
INTRAMUSCULAR | Status: AC
  Filled 2023-09-16: qty 2

## 2023-09-16 MED ORDER — CEFAZOLIN SODIUM-DEXTROSE 2-4 GM/100ML-% IV SOLN
2.0000 g | INTRAVENOUS | Status: AC
  Administered 2023-09-16: 2 g via INTRAVENOUS

## 2023-09-16 MED ORDER — GABAPENTIN 300 MG PO CAPS
300.0000 mg | ORAL_CAPSULE | Freq: Once | ORAL | Status: AC
  Administered 2023-09-16: 300 mg via ORAL

## 2023-09-16 MED ORDER — LACTATED RINGERS IV SOLN: INTRAVENOUS | Status: DC

## 2023-09-16 MED ORDER — CEFAZOLIN SODIUM-DEXTROSE 2-4 GM/100ML-% IV SOLN
INTRAVENOUS | Status: AC
  Filled 2023-09-16: qty 100

## 2023-09-16 MED ORDER — LIDOCAINE-EPINEPHRINE 1 %-1:100000 IJ SOLN
INTRAMUSCULAR | Status: DC | PRN
  Administered 2023-09-16: 40 mL

## 2023-09-16 MED ORDER — GABAPENTIN 300 MG PO CAPS
ORAL_CAPSULE | ORAL | Status: AC
  Filled 2023-09-16: qty 1

## 2023-09-16 MED ORDER — SUGAMMADEX SODIUM 200 MG/2ML IV SOLN
INTRAVENOUS | Status: DC | PRN
  Administered 2023-09-16: 200 mg via INTRAVENOUS
  Administered 2023-09-16: 150 mg via INTRAVENOUS

## 2023-09-16 MED ORDER — PROPOFOL 10 MG/ML IV BOLUS
INTRAVENOUS | Status: DC | PRN
  Administered 2023-09-16: 150 ug/kg/min via INTRAVENOUS
  Administered 2023-09-16: 140 mg via INTRAVENOUS

## 2023-09-16 MED ORDER — MIDAZOLAM HCL 2 MG/2ML IJ SOLN
INTRAMUSCULAR | Status: DC | PRN
  Administered 2023-09-16: 2 mg via INTRAVENOUS

## 2023-09-16 MED ORDER — PHENYLEPHRINE 80 MCG/ML (10ML) SYRINGE FOR IV PUSH (FOR BLOOD PRESSURE SUPPORT)
PREFILLED_SYRINGE | INTRAVENOUS | Status: AC
  Filled 2023-09-16: qty 10

## 2023-09-16 MED ORDER — CHLORHEXIDINE GLUCONATE CLOTH 2 % EX PADS: 6.0000 | MEDICATED_PAD | Freq: Once | CUTANEOUS | Status: DC

## 2023-09-16 MED ORDER — BUPIVACAINE HCL (PF) 0.25 % IJ SOLN
INTRAMUSCULAR | Status: AC
  Filled 2023-09-16: qty 30

## 2023-09-16 MED ORDER — DEXAMETHASONE SODIUM PHOSPHATE 10 MG/ML IJ SOLN
INTRAMUSCULAR | Status: AC
  Filled 2023-09-16: qty 1

## 2023-09-16 MED ORDER — ROCURONIUM BROMIDE 10 MG/ML (PF) SYRINGE
PREFILLED_SYRINGE | INTRAVENOUS | Status: AC
  Filled 2023-09-16: qty 10

## 2023-09-16 SURGICAL SUPPLY — 82 items
BAG DECANTER FOR FLEXI CONT (MISCELLANEOUS) IMPLANT
BINDER BREAST LRG (GAUZE/BANDAGES/DRESSINGS) IMPLANT
BINDER BREAST MEDIUM (GAUZE/BANDAGES/DRESSINGS) IMPLANT
BINDER BREAST XLRG (GAUZE/BANDAGES/DRESSINGS) IMPLANT
BINDER BREAST XXLRG (GAUZE/BANDAGES/DRESSINGS) IMPLANT
BIOPATCH RED 1 DISK 7.0 (GAUZE/BANDAGES/DRESSINGS) IMPLANT
BLADE HEX COATED 2.75 (ELECTRODE) IMPLANT
BLADE KNIFE PERSONA 10 (BLADE) ×6 IMPLANT
BLADE SURG 10 STRL SS (BLADE) ×3 IMPLANT
BLADE SURG 15 STRL LF DISP TIS (BLADE) ×3 IMPLANT
CANISTER SUCT 1200ML W/VALVE (MISCELLANEOUS) ×3 IMPLANT
CLEANSER WND VASHE 34 (WOUND CARE) ×3 IMPLANT
COLLAGEN CELLERATERX 5 GRAM (Miscellaneous) IMPLANT
COVER BACK TABLE 60X90IN (DRAPES) ×3 IMPLANT
COVER MAYO STAND STRL (DRAPES) ×3 IMPLANT
DERMABOND ADVANCED .7 DNX12 (GAUZE/BANDAGES/DRESSINGS) ×6 IMPLANT
DRAIN CHANNEL 19F RND (DRAIN) IMPLANT
DRAPE LAPAROSCOPIC ABDOMINAL (DRAPES) ×3 IMPLANT
DRAPE UTILITY XL STRL (DRAPES) ×3 IMPLANT
DRESSING MEPILEX FLEX 4X4 (GAUZE/BANDAGES/DRESSINGS) IMPLANT
DRSG MEPILEX FLEX 4X4 (GAUZE/BANDAGES/DRESSINGS) IMPLANT
DRSG MEPILEX POST OP 4X8 (GAUZE/BANDAGES/DRESSINGS) ×6 IMPLANT
DRSG TEGADERM 4X4.75 (GAUZE/BANDAGES/DRESSINGS) IMPLANT
ELECT BLADE 4.0 EZ CLEAN MEGAD (MISCELLANEOUS) ×2 IMPLANT
ELECT COATED BLADE 2.86 ST (ELECTRODE) IMPLANT
ELECT REM PT RETURN 9FT ADLT (ELECTROSURGICAL) ×2 IMPLANT
ELECTRODE BLDE 4.0 EZ CLN MEGD (MISCELLANEOUS) ×3 IMPLANT
ELECTRODE REM PT RTRN 9FT ADLT (ELECTROSURGICAL) ×3 IMPLANT
EVACUATOR SILICONE 100CC (DRAIN) IMPLANT
FUNNEL KELLER 2 DISP (MISCELLANEOUS) IMPLANT
GAUZE PAD ABD 8X10 STRL (GAUZE/BANDAGES/DRESSINGS) ×6 IMPLANT
GLOVE BIO SURGEON STRL SZ 6.5 (GLOVE) ×6 IMPLANT
GLOVE BIO SURGEON STRL SZ7.5 (GLOVE) ×3 IMPLANT
GLOVE BIOGEL PI IND STRL 7.0 (GLOVE) IMPLANT
GLOVE BIOGEL PI IND STRL 8 (GLOVE) IMPLANT
GOWN STRL REUS W/ TWL LRG LVL3 (GOWN DISPOSABLE) ×6 IMPLANT
GOWN STRL REUS W/ TWL XL LVL3 (GOWN DISPOSABLE) IMPLANT
GOWN STRL REUS W/TWL XL LVL3 (GOWN DISPOSABLE) IMPLANT
IMPL BREAST P6.5XULT HI 650 (Breast) IMPLANT
IMPL BRST P6.5XULT HI 650CC (Breast) ×2 IMPLANT
IV NS 1000ML BAXH (IV SOLUTION) IMPLANT
LINER CANISTER 1000CC FLEX (MISCELLANEOUS) ×3 IMPLANT
NDL FILTER BLUNT 18X1 1/2 (NEEDLE) IMPLANT
NDL HYPO 22X1.5 SAFETY MO (MISCELLANEOUS) ×3 IMPLANT
NDL HYPO 25X1 1.5 SAFETY (NEEDLE) ×6 IMPLANT
NDL SAFETY ECLIPSE 18X1.5 (NEEDLE) IMPLANT
NEEDLE FILTER BLUNT 18X1 1/2 (NEEDLE) ×2 IMPLANT
NEEDLE HYPO 22X1.5 SAFETY MO (MISCELLANEOUS) IMPLANT
NEEDLE HYPO 25X1 1.5 SAFETY (NEEDLE) ×4 IMPLANT
NS IRRIG 1000ML POUR BTL (IV SOLUTION) IMPLANT
PACK BASIN DAY SURGERY FS (CUSTOM PROCEDURE TRAY) ×3 IMPLANT
PAD ALCOHOL SWAB (MISCELLANEOUS) IMPLANT
PAD FOAM SILICONE BACKED (GAUZE/BANDAGES/DRESSINGS) IMPLANT
PENCIL SMOKE EVACUATOR (MISCELLANEOUS) ×3 IMPLANT
PIN SAFETY STERILE (MISCELLANEOUS) IMPLANT
SIZER BRST REUSE P6.4 650CC (SIZER) IMPLANT
SLEEVE SCD COMPRESS KNEE MED (STOCKING) ×3 IMPLANT
SPIKE FLUID TRANSFER (MISCELLANEOUS) IMPLANT
SPONGE T-LAP 18X18 ~~LOC~~+RFID (SPONGE) ×6 IMPLANT
STRIP SUTURE WOUND CLOSURE 1/2 (MISCELLANEOUS) ×12 IMPLANT
SUT ETHIBOND 0 MO6 C/R (SUTURE) IMPLANT
SUT MNCRL AB 3-0 PS2 18 (SUTURE) ×6 IMPLANT
SUT MNCRL AB 4-0 PS2 18 (SUTURE) ×12 IMPLANT
SUT MON AB 3-0 SH27 (SUTURE) ×12 IMPLANT
SUT MON AB 5-0 PS2 18 (SUTURE) IMPLANT
SUT PDS 3-0 CT2 (SUTURE) ×10 IMPLANT
SUT PDS AB 2-0 CT2 27 (SUTURE) IMPLANT
SUT PDS II 3-0 CT2 27 ABS (SUTURE) ×12 IMPLANT
SUT SILK 3 0 PS 1 (SUTURE) IMPLANT
SUT VIC AB 3-0 SH 27X BRD (SUTURE) IMPLANT
SUT VIC AB 4-0 PS2 18 (SUTURE) IMPLANT
SYR 50ML LL SCALE MARK (SYRINGE) IMPLANT
SYR BULB IRRIG 60ML STRL (SYRINGE) ×3 IMPLANT
SYR CONTROL 10ML LL (SYRINGE) ×6 IMPLANT
TAPE MEASURE VINYL STERILE (MISCELLANEOUS) IMPLANT
TOWEL GREEN STERILE FF (TOWEL DISPOSABLE) ×9 IMPLANT
TRAY DSU PREP LF (CUSTOM PROCEDURE TRAY) ×3 IMPLANT
TUBE CONNECTING 20X1/4 (TUBING) ×3 IMPLANT
TUBING INFILTRATION IT-10001 (TUBING) IMPLANT
TUBING SET GRADUATE ASPIR 12FT (MISCELLANEOUS) IMPLANT
UNDERPAD 30X36 HEAVY ABSORB (UNDERPADS AND DIAPERS) ×6 IMPLANT
YANKAUER SUCT BULB TIP NO VENT (SUCTIONS) ×3 IMPLANT

## 2023-09-16 NOTE — Transfer of Care (Signed)
 Immediate Anesthesia Transfer of Care Note  Patient: Trena L Terrill  Procedure(s) Performed: Procedure(s) (LRB): REMOVAL, TISSUE EXPANDER, BREAST, WITH IMPLANT INSERTION (Right) BREAST REDUCTION WITH LIPOSUCTION (Left)  Patient Location: PACU  Anesthesia Type: General  Level of Consciousness: awake, sedated and patient cooperative  Airway & Oxygen Therapy: Patient Spontanous Breathing and Patient connected to face mask oxygen  Post-op Assessment: Report given to PACU RN and Post -op Vital signs reviewed and stable  Post vital signs: Reviewed and stable  Complications: No apparent anesthesia complications Last Vitals:  Vitals Value Taken Time  BP 151/86 09/16/23 0932  Temp    Pulse 76 09/16/23 0939  Resp 10 09/16/23 0939  SpO2 100 % 09/16/23 0939  Vitals shown include unfiled device data.  Last Pain:  Vitals:   09/16/23 0639  TempSrc: Oral  PainSc: 0-No pain      Patients Stated Pain Goal: 5 (09/16/23 1610)  Complications: No notable events documented.

## 2023-09-16 NOTE — Anesthesia Procedure Notes (Signed)
 Procedure Name: Intubation Date/Time: 09/16/2023 7:36 AM  Performed by: Francie Massing, CRNAPre-anesthesia Checklist: Patient identified, Emergency Drugs available, Suction available and Patient being monitored Patient Re-evaluated:Patient Re-evaluated prior to induction Oxygen Delivery Method: Circle system utilized Preoxygenation: Pre-oxygenation with 100% oxygen Induction Type: IV induction Ventilation: Mask ventilation without difficulty Laryngoscope Size: Mac and 3 Grade View: Grade I Tube type: Oral Tube size: 7.0 mm Number of attempts: 1 Airway Equipment and Method: Stylet and Oral airway Placement Confirmation: ETT inserted through vocal cords under direct vision, positive ETCO2 and breath sounds checked- equal and bilateral Secured at: 22 cm Tube secured with: Tape Dental Injury: Teeth and Oropharynx as per pre-operative assessment

## 2023-09-16 NOTE — Discharge Instructions (Addendum)
 TOOK tylenol and gabapentin at 0645. No tylenol products until after 1pm      INSTRUCTIONS FOR AFTER BREAST SURGERY   You will likely have some questions about what to expect following your operation.  The following information will help you and your family understand what to expect when you are discharged from the hospital.  It is important to follow these guidelines to help ensure a smooth recovery and reduce complication.  Postoperative instructions include information on: diet, wound care, medications and physical activity.  AFTER SURGERY Expect to go home after the procedure.  In some cases, you may need to spend one night in the hospital for observation.  DIET Breast surgery does not require a specific diet.  However, the healthier you eat the better your body will heal. It is important to increasing your protein intake.  This means limiting the foods with sugar and carbohydrates.  Focus on vegetables and some meat.  If you have liposuction during your procedure be sure to drink water.  If your urine is bright yellow, then it is concentrated, and you need to drink more water.  As a general rule after surgery, you should have 8 ounces of water every hour while awake.  If you find you are persistently nauseated or unable to take in liquids let us know.  NO TOBACCO USE or EXPOSURE.  This will slow your healing process and lead to a wound.  WOUND CARE Leave the binder on for 3 days . Use fragrance free soap like Dial, Dove or [place].   After 3 days you can remove the binder to shower. Once dry apply binder or sports bra. If you have liposuction you will have a soft and spongy dressing (Lipofoam) that helps prevent creases in your skin.  Remove before you shower and then replace it.  It is also available on [employer]. If you have steri-strips / tape directly attached to your skin leave them in place. It is OK to get these wet.   No baths, pools or hot tubs for four weeks. We close your incision  to leave the smallest and best-looking scar. No ointment or creams on your incisions for four weeks.  No Neosporin (Too many skin reactions).  A few weeks after surgery you can use Mederma and start massaging the scar. We ask you to wear your binder or sports bra for the first 6 weeks around the clock, including while sleeping. This provides added comfort and helps reduce the fluid accumulation at the surgery site. NO Ice or heating pads to the operative site.  You have a very high risk of a BURN before you feel the temperature change.  ACTIVITY No heavy lifting until cleared by the doctor.  This usually means no more than a half-gallon of milk.  It is OK to walk and climb stairs. Moving your legs is very important to decrease your risk of a blood clot.  It will also help keep you from getting deconditioned.  Every 1 to 2 hours get up and walk for 5 minutes. This will help with a quicker recovery back to normal.  Let pain be your guide so you don't do too much.  This time is for you to recover.  You will be more comfortable if you sleep and rest with your head elevated either with a few pillows under you or in a recliner.  No stomach sleeping for a three months.  WORK Everyone returns to work at different times. As a rough  guide, most people take at least 1 - 2 weeks off prior to returning to work. If you need documentation for your job, give the forms to the front staff at the clinic.  DRIVING Arrange for someone to bring you home from the hospital after your surgery.  You may be able to drive a few days after surgery but not while taking any narcotics or valium.  BOWEL MOVEMENTS Constipation can occur after anesthesia and while taking pain medication.  It is important to stay ahead for your comfort.  We recommend taking Milk of Magnesia (2 tablespoons; twice a day) while taking the pain pills.  MEDICATIONS You may be prescribed should start after surgery At your preoperative visit for you history  and physical you may have been given the following medications: An antibiotic: Start this medication when you get home and take according to the instructions on the bottle. Zofran 4 mg:  This is to treat nausea and vomiting.  You can take this every 6 hours as needed and only if needed. Valium 2 mg for breast cancer patients: This is for muscle tightness if you have an implant or expander. This will help relax your muscle which also helps with pain control.  This can be taken every 12 hours as needed. Don't drive after taking this medication. Norco (hydrocodone/acetaminophen) 5/325 mg:  This is only to be used after you have taken the Motrin or the Tylenol. Every 8 hours as needed.   Over the counter Medication to take: Ibuprofen (Motrin) 600 mg:  Take this every 6 hours.  If you have additional pain then take 500 mg of the Tylenol every 8 hours.  Only take the Norco after you have tried these two. MiraLAX or Milk of Magnesia: Take this according to the bottle if you take the Norco.  WHEN TO CALL Call your surgeon's office if any of the following occur: Fever 101 degrees F or greater Excessive bleeding or fluid from the incision site. Pain that increases over time without aid from the medications Redness, warmth, or pus draining from incision sites Persistent nausea or inability to take in liquids Severe misshapen area that underwent the operation.  Post Anesthesia Home Care Instructions  Activity: Get plenty of rest for the remainder of the day. A responsible individual must stay with you for 24 hours following the procedure.  For the next 24 hours, DO NOT: -Drive a car -Advertising copywriter -Drink alcoholic beverages -Take any medication unless instructed by your physician -Make any legal decisions or sign important papers.  Meals: Start with liquid foods such as gelatin or soup. Progress to regular foods as tolerated. Avoid greasy, spicy, heavy foods. If nausea and/or vomiting occur,  drink only clear liquids until the nausea and/or vomiting subsides. Call your physician if vomiting continues.  Special Instructions/Symptoms: Your throat may feel dry or sore from the anesthesia or the breathing tube placed in your throat during surgery. If this causes discomfort, gargle with warm salt water. The discomfort should disappear within 24 hours.  If you had a scopolamine patch placed behind your ear for the management of post- operative nausea and/or vomiting:  1. The medication in the patch is effective for 72 hours, after which it should be removed.  Wrap patch in a tissue and discard in the trash. Wash hands thoroughly with soap and water. 2. You may remove the patch earlier than 72 hours if you experience unpleasant side effects which may include dry mouth, dizziness or visual disturbances.  3. Avoid touching the patch. Wash your hands with soap and water after contact with the patch.    Post Anesthesia Home Care Instructions  Activity: Get plenty of rest for the remainder of the day. A responsible individual must stay with you for 24 hours following the procedure.  For the next 24 hours, DO NOT: -Drive a car -Advertising copywriter -Drink alcoholic beverages -Take any medication unless instructed by your physician -Make any legal decisions or sign important papers.  Meals: Start with liquid foods such as gelatin or soup. Progress to regular foods as tolerated. Avoid greasy, spicy, heavy foods. If nausea and/or vomiting occur, drink only clear liquids until the nausea and/or vomiting subsides. Call your physician if vomiting continues.  Special Instructions/Symptoms: Your throat may feel dry or sore from the anesthesia or the breathing tube placed in your throat during surgery. If this causes discomfort, gargle with warm salt water. The discomfort should disappear within 24 hours.  If you had a scopolamine patch placed behind your ear for the management of post- operative  nausea and/or vomiting:  1. The medication in the patch is effective for 72 hours, after which it should be removed.  Wrap patch in a tissue and discard in the trash. Wash hands thoroughly with soap and water. 2. You may remove the patch earlier than 72 hours if you experience unpleasant side effects which may include dry mouth, dizziness or visual disturbances. 3. Avoid touching the patch. Wash your hands with soap and water after contact with the patch.   Information for Discharge Teaching: EXPAREL (bupivacaine liposome injectable suspension)   Pain relief is important to your recovery. The goal is to control your pain so you can move easier and return to your normal activities as soon as possible after your procedure. Your physician may use several types of medicines to manage pain, swelling, and more.  Your surgeon or anesthesiologist gave you EXPAREL(bupivacaine) to help control your pain after surgery.  EXPAREL is a local anesthetic designed to release slowly over an extended period of time to provide pain relief by numbing the tissue around the surgical site. EXPAREL is designed to release pain medication over time and can control pain for up to 72 hours. Depending on how you respond to EXPAREL, you may require less pain medication during your recovery. EXPAREL can help reduce or eliminate the need for opioids during the first few days after surgery when pain relief is needed the most. EXPAREL is not an opioid and is not addictive. It does not cause sleepiness or sedation.   Important! A teal colored band has been placed on your arm with the date, time and amount of EXPAREL you have received. Please leave this armband in place for the full 96 hours following administration, and then you may remove the band. If you return to the hospital for any reason within 96 hours following the administration of EXPAREL, the armband provides important information that your health care providers to know,  and alerts them that you have received this anesthetic.    Possible side effects of EXPAREL: Temporary loss of sensation or ability to move in the area where medication was injected. Nausea, vomiting, constipation Rarely, numbness and tingling in your mouth or lips, lightheadedness, or anxiety may occur. Call your doctor right away if you think you may be experiencing any of these sensations, or if you have other questions regarding possible side effects.  Follow all other discharge instructions given to you by your surgeon  or nurse. Eat a healthy diet and drink plenty of water or other fluids.

## 2023-09-16 NOTE — Op Note (Signed)
 OR Report   DATE OF OPERATION:  09/16/2023  LOCATION: [facility]  SURGICAL DIVISION: Plastic Surgery  PREOPERATIVE DIAGNOSES:  1. History of right breast cancer.  2. Acquired absence of right breast.  3. Left Breast asymmetry.  POSTOPERATIVE DIAGNOSES:  Same as preoperative diagnosis.   PROCEDURE:  1. Exchange of right tissue expander for implant. 2. Left breast reduction with liposuction for symmetry 600 gm. 3. Excision of right breast excess (skin and soft tissue) tissue 2 x 9 cm.  SURGEON: Foster Simpson, DO  ASSISTANT: Evelena Leyden, PA  ANESTHESIA:  General.   COMPLICATIONS: None.   IMPLANTS: [employer] Smooth Round Ultra High Profile Gel 650 cc. Ref #[id number].  Serial Number [id number]  INDICATIONS FOR PROCEDURE:  The patient, Stephanie Roth, is a 68 y.o. female born on [birth year], is here for further treatment after a mastectomy and placement of a tissue expander. She now presents for exchange of her expander for an implant.  She requires capsulotomies to better position the implant. She also has left breast asymmetry. MRN: [id number]  CONSENT:  Informed consent was obtained directly from the patient. Risks, benefits and alternatives were fully discussed. Specific risks including but not limited to bleeding, infection, hematoma, seroma, scarring, pain, implant infection, implant extrusion, capsular contracture, asymmetry, wound healing problems, and need for further surgery were all discussed. The patient did have an ample opportunity to have her questions answered to her satisfaction.   DESCRIPTION OF PROCEDURE:  The patient was taken to the operating room. SCDs were placed and IV antibiotics were given. The patient's chest was prepped and draped in a sterile fashion. A time out was performed and the implants to be used were identified.  Local with epinephrine was used to infiltrate the area.   Right: The old mastectomy scar was excised to include  the excess skin and soft tissue of 2 x 9 cm.  The bovie was used to dissect to the ADM.  The ADM was opened and superior mastectomy and inferior mastectomy flaps were raised for 2 cm to allow for a tension free closure.  The ADM was split and the expander removed.  Inspection of the pocket showed a normal healthy capsule and good integration of the biologic matrix.  Mild capsulotomy was done medially.  The pocket was irrigated with Vashe.  Measurements were made to confirm adequate pocket size for the implant dimensions.  Hemostasis was ensured with electrocautery.  Sizer was tried and we went 50 cc larger  New gloves were placed.  Mild liposuction was done of the lateral breast. The implant was placed in the pocket using the keller funnel and oriented appropriately. The pectoralis major muscle and capsule on the anterior surface were re-closed with a 3-0 PDS suture. The remaining skin was closed with 3-0 PDS deep dermal and 4-0 Monocryl subcuticular stitches.  Dermabond was applied.  A breast binder and ABD was applied.  The patient was allowed to wake from anesthesia and taken to the recovery room in satisfactory condition.   Left side: Preoperative markings were confirmed.  Incision lines were injected with local containing epinephrine.  Tumescent was placed in the lateral breast and liposuction done for improved contour. After waiting for vasoconstriction, the marked lines were incised with a #15 blade.  A Wise-pattern superomedial breast reduction was performed by de-epithelializing the pedicle, using bovie to create the superomedial pedicle, and removing breast tissue from the superior, lateral, and inferior portions of the breast.  Care was taken to  not undermine the breast pedicle. Hemostasis was achieved.  The nipple was gently rotated into position and the soft tissue was closed with 4-0 Monocryl.  The patient was sat upright and size and shape symmetry was confirmed.  Experel and Cellerate were placed  in the pocket. The pocket was irrigated and hemostasis confirmed.  The deep tissues were approximated with 3-0 PDS sutures. The skin was closed with deep dermal 3-0 Monocryl and subcuticular 4-0 Monocryl sutures.  Dermabond was applied.  A breast binder and ABDs were placed.  The nipple and skin flaps had good capillary refill at the end of the procedure.  The patient tolerated the procedure well. The patient was allowed to wake from anesthesia and taken to the recovery room in satisfactory condition.  The advanced practice practitioner (APP) assisted throughout the case.  The APP was essential in retraction and counter traction when needed to make the case progress smoothly.  This retraction and assistance made it possible to see the tissue plans for the procedure.  The assistance was needed for blood control, tissue re-approximation and assisted with closure of the incision site.

## 2023-09-16 NOTE — Interval H&P Note (Signed)
 History and Physical Interval Note:  09/16/2023 7:04 AM  Stephanie Roth  has presented today for surgery, with the diagnosis of history of breast cancer.  The various methods of treatment have been discussed with the patient and family. After consideration of risks, benefits and other options for treatment, the patient has consented to  Procedure(s) with comments: REMOVAL, TISSUE EXPANDER, BREAST, WITH IMPLANT INSERTION (Right) - right breast expander removal and placement of silicone implant, left breast reduction BREAST REDUCTION WITH LIPOSUCTION (Left) as a surgical intervention.  The patient's history has been reviewed, patient examined, no change in status, stable for surgery.  I have reviewed the patient's chart and labs.  Questions were answered to the patient's satisfaction.     Alena Bills Reshard Guillet

## 2023-09-17 ENCOUNTER — Encounter (HOSPITAL_BASED_OUTPATIENT_CLINIC_OR_DEPARTMENT_OTHER): Payer: Self-pay | Admitting: Plastic Surgery

## 2023-09-17 LAB — SURGICAL PATHOLOGY

## 2023-09-17 NOTE — Anesthesia Postprocedure Evaluation (Signed)
 Anesthesia Post Note  Patient: Stephanie Roth  Procedure(s) Performed: REMOVAL, TISSUE EXPANDER, BREAST, WITH IMPLANT INSERTION (Right: Chest) BREAST REDUCTION WITH LIPOSUCTION (Left: Breast)     Patient location during evaluation: PACU Anesthesia Type: General Level of consciousness: sedated and patient cooperative Pain management: pain level controlled Vital Signs Assessment: post-procedure vital signs reviewed and stable Respiratory status: spontaneous breathing Cardiovascular status: stable Anesthetic complications: no   No notable events documented.  Last Vitals:  Vitals:   09/16/23 1100 09/16/23 1120  BP: (!) 167/83 137/80  Pulse: 79 79  Resp: (!) 23 18  Temp:  (!) 36.2 C  SpO2: 94% 93%    Last Pain:  Vitals:   09/16/23 1120  TempSrc: Temporal  PainSc: 0-No pain                 Lewie Loron

## 2023-09-21 ENCOUNTER — Encounter: Payer: Self-pay | Admitting: Plastic Surgery

## 2023-09-21 ENCOUNTER — Ambulatory Visit: Admitting: Plastic Surgery

## 2023-09-21 VITALS — BP 135/84 | HR 78 | Ht 59.0 in | Wt 154.8 lb

## 2023-09-21 DIAGNOSIS — D0511 Intraductal carcinoma in situ of right breast: Secondary | ICD-10-CM

## 2023-09-21 DIAGNOSIS — Z9889 Other specified postprocedural states: Secondary | ICD-10-CM

## 2023-09-21 DIAGNOSIS — C50411 Malignant neoplasm of upper-outer quadrant of right female breast: Secondary | ICD-10-CM

## 2023-09-21 NOTE — Progress Notes (Signed)
 The patient is a 68 yrs old female here for follow up after undergoing a left breast reduction and right expander removal with implant placement 4/3.  There is some swelling and bruising as expected. No sign of seroma.  She is doing well and pain is well controlled.  We will plan pictures at her next visit in 1-2 weeks.

## 2023-09-30 ENCOUNTER — Telehealth: Payer: Self-pay | Admitting: Plastic Surgery

## 2023-09-30 ENCOUNTER — Encounter: Payer: Self-pay | Admitting: Surgical

## 2023-09-30 NOTE — Telephone Encounter (Signed)
 Patient has appt on 4/23, recommend discussing with provider at that time. She will likely be fine to return to PT next week depending on evaluation.

## 2023-09-30 NOTE — Telephone Encounter (Signed)
 Spoke with patient, discussed with her that we can send her a referral to PT at her appointment next week.  Discussed options for light range of motion like spider crawls up the wall to assist with range of motion.  Also discussed importance of continue with compression.  She is otherwise doing well.

## 2023-09-30 NOTE — Telephone Encounter (Signed)
 Patient would like to know when she needs to start Physical therapy plea reach out and advise

## 2023-10-05 DIAGNOSIS — M9901 Segmental and somatic dysfunction of cervical region: Secondary | ICD-10-CM | POA: Diagnosis not present

## 2023-10-05 DIAGNOSIS — M5384 Other specified dorsopathies, thoracic region: Secondary | ICD-10-CM | POA: Diagnosis not present

## 2023-10-05 DIAGNOSIS — M9902 Segmental and somatic dysfunction of thoracic region: Secondary | ICD-10-CM | POA: Diagnosis not present

## 2023-10-05 DIAGNOSIS — M50123 Cervical disc disorder at C6-C7 level with radiculopathy: Secondary | ICD-10-CM | POA: Diagnosis not present

## 2023-10-06 ENCOUNTER — Encounter: Payer: Self-pay | Admitting: Physician Assistant

## 2023-10-06 ENCOUNTER — Ambulatory Visit (INDEPENDENT_AMBULATORY_CARE_PROVIDER_SITE_OTHER): Admitting: Physician Assistant

## 2023-10-06 ENCOUNTER — Other Ambulatory Visit: Payer: Self-pay | Admitting: Endocrinology

## 2023-10-06 VITALS — BP 157/82 | HR 76

## 2023-10-06 DIAGNOSIS — Z9889 Other specified postprocedural states: Secondary | ICD-10-CM

## 2023-10-06 DIAGNOSIS — E041 Nontoxic single thyroid nodule: Secondary | ICD-10-CM

## 2023-10-06 DIAGNOSIS — C50411 Malignant neoplasm of upper-outer quadrant of right female breast: Secondary | ICD-10-CM

## 2023-10-06 NOTE — Progress Notes (Signed)
 Patient is a pleasant 68 year old female s/p removal of right breast tissue expander and placement of breast implant with contralateral oncoplastic reduction for symmetry performed 09/16/2023 by Dr. Orin Birk who presents to clinic for postoperative follow-up.  She was last seen here in clinic on 09/21/2023.  At that time, some swelling and bruising as expected.  However, no seroma or hematoma.  Plan for pictures at subsequent encounter.  Today, patient is doing well.  No significant postop concerns or complaints.  Specifically, denies any leg swelling, difficulty breathing, asymmetric swelling or bruising, fevers, or severe pain.  She does endorse some discomfort on the lateral aspect of her reconstruction site as well as reduction site.  She did have liposuction performed on the left lateral aspect, consistent with her area of discomfort.  Of note, patient also endorses left shoulder pain, unchanged from what she was experiencing preoperatively.  She is seeing a chiropractor for this and seeing some improvement.  She thinks it may be radicular from DDD.  On exam, breasts have significant improvement with regard to shape and symmetry.  Soft throughout.  Implant appropriately placed.  Left NAC is healthy and viable, good capillary refill.  No areas of discrete firmness.  Steri-Strips removed throughout.  Incisions CDI.  No incisional wounds noted.  Recommend continued activity modifications and compressive garments.  Follow-up in 2 to 3 weeks for likely final postoperative encounter.  She can apply Vaseline or Aquaphor in the interim picture(s) obtained of the patient and placed in the chart were with the patient's or guardian's permission.

## 2023-10-12 DIAGNOSIS — M5384 Other specified dorsopathies, thoracic region: Secondary | ICD-10-CM | POA: Diagnosis not present

## 2023-10-12 DIAGNOSIS — M50123 Cervical disc disorder at C6-C7 level with radiculopathy: Secondary | ICD-10-CM | POA: Diagnosis not present

## 2023-10-12 DIAGNOSIS — M9901 Segmental and somatic dysfunction of cervical region: Secondary | ICD-10-CM | POA: Diagnosis not present

## 2023-10-12 DIAGNOSIS — M9902 Segmental and somatic dysfunction of thoracic region: Secondary | ICD-10-CM | POA: Diagnosis not present

## 2023-10-13 DIAGNOSIS — R011 Cardiac murmur, unspecified: Secondary | ICD-10-CM | POA: Diagnosis not present

## 2023-10-14 DIAGNOSIS — I10 Essential (primary) hypertension: Secondary | ICD-10-CM | POA: Diagnosis not present

## 2023-10-14 DIAGNOSIS — J019 Acute sinusitis, unspecified: Secondary | ICD-10-CM | POA: Diagnosis not present

## 2023-10-18 ENCOUNTER — Ambulatory Visit
Admission: RE | Admit: 2023-10-18 | Discharge: 2023-10-18 | Disposition: A | Discharge status: Home / Self Care | Source: Ambulatory Visit | Attending: Endocrinology | Admitting: Endocrinology

## 2023-10-18 DIAGNOSIS — E042 Nontoxic multinodular goiter: Secondary | ICD-10-CM | POA: Diagnosis not present

## 2023-10-18 DIAGNOSIS — E041 Nontoxic single thyroid nodule: Secondary | ICD-10-CM

## 2023-10-19 DIAGNOSIS — M5384 Other specified dorsopathies, thoracic region: Secondary | ICD-10-CM | POA: Diagnosis not present

## 2023-10-19 DIAGNOSIS — M50123 Cervical disc disorder at C6-C7 level with radiculopathy: Secondary | ICD-10-CM | POA: Diagnosis not present

## 2023-10-19 DIAGNOSIS — M9901 Segmental and somatic dysfunction of cervical region: Secondary | ICD-10-CM | POA: Diagnosis not present

## 2023-10-19 DIAGNOSIS — M9902 Segmental and somatic dysfunction of thoracic region: Secondary | ICD-10-CM | POA: Diagnosis not present

## 2023-10-26 NOTE — Progress Notes (Signed)
 Patient is a pleasant 68 year old female s/p removal of right breast tissue expander and placement of breast implant with contralateral oncoplastic reduction for symmetry performed 09/16/2023 by Dr. Orin Birk who presents to clinic for postoperative follow-up.   She was last seen here in clinic on 10/06/2023.  At that time, she had some discomfort in the lateral aspect of her reconstruction site as well as at the reduction site.  Liposuction performed on the left lateral aspect, consistent with area of discomfort.  She also sees a chiropractor for chronic left shoulder pain thought to be radicular from DDD.  Exam demonstrates significant improvement with regard to shape and symmetry.  Soft throughout.  Recommended follow-up in 2 to 3 weeks for photos and likely final postoperative encounter.  Today, she is doing well.  She does feel as though the left breast is still slightly larger than the right breast.  However, she is overall pleased with the outcome.  She has not been putting anything on her incisions.  She continues to have left shoulder and elbow discomfort, managed by her chiropractor.  She reveals that recent cervical imaging revealed DDD.  On exam, breasts have significantly improved shape and symmetry compared to preoperative photos.  Breasts are soft throughout.  No discrete areas of firmness.  Mild amount of residual Dermabond overlying left NAC and vertical limb.  Incisions are all healing appropriately.  Recommending liberal amount of Vaseline or Aquaphor to the left NAC/vertical limb to help with residual Dermabond.  She can then transition to silicone scar gel twice daily x 3 months once there is no residual skin glue.  Follow-up with Dr. Orin Birk in 6 to 12 months for reconstruction follow-up.  Picture(s) obtained of the patient and placed in the chart were with the patient's or guardian's permission.

## 2023-10-27 ENCOUNTER — Encounter: Payer: Self-pay | Admitting: Physician Assistant

## 2023-10-27 ENCOUNTER — Ambulatory Visit: Admitting: Physician Assistant

## 2023-10-27 VITALS — BP 152/87 | HR 67

## 2023-10-27 DIAGNOSIS — M50123 Cervical disc disorder at C6-C7 level with radiculopathy: Secondary | ICD-10-CM | POA: Diagnosis not present

## 2023-10-27 DIAGNOSIS — M5384 Other specified dorsopathies, thoracic region: Secondary | ICD-10-CM | POA: Diagnosis not present

## 2023-10-27 DIAGNOSIS — M9901 Segmental and somatic dysfunction of cervical region: Secondary | ICD-10-CM | POA: Diagnosis not present

## 2023-10-27 DIAGNOSIS — M9902 Segmental and somatic dysfunction of thoracic region: Secondary | ICD-10-CM | POA: Diagnosis not present

## 2023-10-27 DIAGNOSIS — Z9889 Other specified postprocedural states: Secondary | ICD-10-CM

## 2023-10-27 DIAGNOSIS — C50411 Malignant neoplasm of upper-outer quadrant of right female breast: Secondary | ICD-10-CM

## 2023-10-28 DIAGNOSIS — E0590 Thyrotoxicosis, unspecified without thyrotoxic crisis or storm: Secondary | ICD-10-CM | POA: Diagnosis not present

## 2023-10-28 NOTE — Telephone Encounter (Signed)
 err

## 2023-11-03 ENCOUNTER — Telehealth: Payer: Self-pay | Admitting: Plastic Surgery

## 2023-11-03 NOTE — Telephone Encounter (Signed)
 Spoke with Stephanie Roth, all questions answered

## 2023-11-03 NOTE — Telephone Encounter (Signed)
 Patient has questions about restrictions and if she has any and what they are

## 2023-11-04 DIAGNOSIS — E041 Nontoxic single thyroid nodule: Secondary | ICD-10-CM | POA: Diagnosis not present

## 2023-11-04 DIAGNOSIS — H0520 Unspecified exophthalmos: Secondary | ICD-10-CM | POA: Diagnosis not present

## 2023-11-04 DIAGNOSIS — I10 Essential (primary) hypertension: Secondary | ICD-10-CM | POA: Diagnosis not present

## 2023-11-04 DIAGNOSIS — E0590 Thyrotoxicosis, unspecified without thyrotoxic crisis or storm: Secondary | ICD-10-CM | POA: Diagnosis not present

## 2023-11-22 DIAGNOSIS — M9901 Segmental and somatic dysfunction of cervical region: Secondary | ICD-10-CM | POA: Diagnosis not present

## 2023-11-22 DIAGNOSIS — M9903 Segmental and somatic dysfunction of lumbar region: Secondary | ICD-10-CM | POA: Diagnosis not present

## 2023-11-22 DIAGNOSIS — M9902 Segmental and somatic dysfunction of thoracic region: Secondary | ICD-10-CM | POA: Diagnosis not present

## 2023-11-22 DIAGNOSIS — M542 Cervicalgia: Secondary | ICD-10-CM | POA: Diagnosis not present

## 2023-11-22 DIAGNOSIS — M62830 Muscle spasm of back: Secondary | ICD-10-CM | POA: Diagnosis not present

## 2023-11-22 DIAGNOSIS — M546 Pain in thoracic spine: Secondary | ICD-10-CM | POA: Diagnosis not present

## 2023-11-22 DIAGNOSIS — M545 Low back pain, unspecified: Secondary | ICD-10-CM | POA: Diagnosis not present

## 2023-11-22 DIAGNOSIS — M62838 Other muscle spasm: Secondary | ICD-10-CM | POA: Diagnosis not present

## 2024-01-02 NOTE — Therapy (Unsigned)
 OUTPATIENT PHYSICAL THERAPY BREAST CANCER POST OP FOLLOW UP   Patient Name: Stephanie Roth MRN: [id number] DOB:09/12/1955, 68 y.o., female Today's Date: 01/02/2024  END OF SESSION:   Past Medical History:  Diagnosis Date   Cancer (HCC) 02/2023   right breast DCIS   DUB (dysfunctional uterine bleeding)    Fibroids    uterine   Goiter 06/16/2007   Hypertension    Iron deficiency anemia    Joint pain    Menopause    Past Surgical History:  Procedure Laterality Date   blepheroplasty Bilateral    BREAST BIOPSY Right 02/08/2023   MM RT BREAST BX W LOC DEV 1ST LESION IMAGE BX SPEC STEREO GUIDE 02/08/2023 [facility]   BREAST BIOPSY Right 02/08/2023   MM RT BREAST BX W LOC DEV EA AD LESION IMG BX SPEC STEREO GUIDE 02/08/2023 [facility]   BREAST RECONSTRUCTION WITH PLACEMENT OF TISSUE EXPANDER AND FLEX HD (ACELLULAR HYDRATED DERMIS) Right 04/19/2023   Procedure: IMMEDIATE RIGHT BREAST RECONSTRUCTION WITH PLACEMENT OF TISSUE EXPANDER AND FLEX HD (ACELLULAR HYDRATED DERMIS);  Surgeon: Lowery Estefana RAMAN, DO;  Location: [facility];  Service: Plastics;  Laterality: Right;   BREAST REDUCTION SURGERY Left 09/16/2023   Procedure: BREAST REDUCTION WITH LIPOSUCTION;  Surgeon: Lowery Estefana RAMAN, DO;  Location: [facility];  Service: Plastics;  Laterality: Left;   CESAREAN SECTION     COLONOSCOPY     ECTOPIC PREGNANCY SURGERY     GUM SURGERY     MASTECTOMY W/ SENTINEL NODE BIOPSY Right 04/19/2023   Procedure: RIGHT MASTECTOMY AND SENTINEL NODE BIOPSY;  Surgeon: Curvin Deward MOULD, MD;  Location: [facility];  Service: General;  Laterality: Right;  PEC BLOCK   REMOVAL OF TISSUE EXPANDER AND PLACEMENT OF IMPLANT Right 09/16/2023   Procedure: REMOVAL, TISSUE EXPANDER, BREAST, WITH IMPLANT INSERTION;  Surgeon: Lowery Estefana RAMAN, DO;  Location: [facility];  Service: Plastics;  Laterality: Right;  right breast expander removal and placement of silicone implant, left  breast reduction   TUBAL LIGATION     Patient Active Problem List   Diagnosis Date Noted   Breast cancer (HCC) 04/19/2023   Genetic testing 03/02/2023   Ductal carcinoma in situ (DCIS) of right breast 02/16/2023   History of hyperthyroidism 09/03/2010   Family history of diabetes mellitus 09/03/2010    REFERRING PROVIDER: Dr. Deward Curvin  REFERRING DIAG: Right breast cancer  THERAPY DIAG:  Ductal carcinoma in situ (DCIS) of right breast  Abnormal posture  Aftercare following surgery for neoplasm  Rationale for Evaluation and Treatment: Rehabilitation  ONSET DATE: 09/16/2023  SUBJECTIVE:  SUBJECTIVE STATEMENT: Patient underwent a right mastectomy and sentinel node biopsy (9 negative nodes) on 04/19/2023. She had a right expander placed for breast reconstruction and a permanent implant placed and a left breast reduction surgery on 09/16/2023. No further cancer treatment is needed.  PERTINENT HISTORY:  Patient was diagnosed on 01/20/2023 with right high grade DCIS. It measures 9.5 cm. It is ER/PR negative.   PATIENT GOALS:  Reassess how my recovery is going related to arm function, pain, and swelling.  PAIN:  Are you having pain? {OPRCPAIN:27236}  PRECAUTIONS: Recent Surgery, right UE Lymphedema risk, Other: recent breast reconstruction  RED FLAGS: {PT Red Flags:29287}   ACTIVITY LEVEL / LEISURE: ***   OBJECTIVE:   PATIENT SURVEYS:  QUICK DASH: ***  OBSERVATIONS: ***  POSTURE:  ***  LYMPHEDEMA ASSESSMENT:   UPPER EXTREMITY AROM/PROM:   A/PROM RIGHT   eval   RIGHT 01/02/2024  Shoulder extension 37   Shoulder flexion 152   Shoulder abduction 155   Shoulder internal rotation 51   Shoulder external rotation 89                           (Blank rows = not tested)   A/PROM LEFT    eval LEFT 01/02/2024  Shoulder extension 40   Shoulder flexion 136   Shoulder abduction 127   Shoulder internal rotation 59   Shoulder external rotation 78                           (Blank rows = not tested)   CERVICAL AROM: All within normal limits   UPPER EXTREMITY STRENGTH: WNL   LYMPHEDEMA ASSESSMENTS (in cm):    LANDMARK RIGHT   eval RIGHT 01/02/2024  10 cm proximal to olecranon process 30.4   Olecranon process 23.6   10 cm proximal to ulnar styloid process 21.6   Just proximal to ulnar styloid process 14.6   Across hand at thumb web space 18.2   At base of 2nd digit 6.5   (Blank rows = not tested)   LANDMARK LEFT   eval LEFT 01/02/2024  10 cm proximal to olecranon process 30.5   Olecranon process 23.3   10 cm proximal to ulnar styloid process 20.8   Just proximal to ulnar styloid process 13.8   Across hand at thumb web space 17.7   At base of 2nd digit 6.3   (Blank rows = not tested)    Surgery type/Date: 04/19/2023 right mastectomy with sentinel node biopsy Number of lymph nodes removed: 9 Current/past treatment (chemo, radiation, hormone therapy): *** Other symptoms:  Heaviness/tightness {yes/no:20286} Pain {yes/no:20286} Pitting edema {yes/no:20286} Infections {yes/no:20286} Decreased scar mobility {yes/no:20286} Stemmer sign {yes/no:20286}  PATIENT EDUCATION:  Education details: *** Person educated: {Person educated:25204} Education method: {Education Method:25205} Education comprehension: {Education Comprehension:25206}  HOME EXERCISE PROGRAM: Reviewed previously given post op HEP. ***  ASSESSMENT:  CLINICAL IMPRESSION: ***  Pt will benefit from skilled therapeutic intervention to improve on the following deficits: Decreased knowledge of precautions, impaired UE functional use, pain, decreased ROM, postural dysfunction.   PT treatment/interventions: ADL/Self care home management, {rehab planned interventions:25118::97110-Therapeutic  exercises,97530- Therapeutic [phone]- Neuromuscular re-education,97535- Self Rjmz,02859- Manual therapy}   GOALS: Goals reviewed with patient? Yes  LONG TERM GOALS:  (STG=LTG)  GOALS Name Target Date  Goal status  1 Pt will demonstrate she has regained full shoulder ROM and function post operatively compared to baselines.  Baseline: ***  INITIAL  2  *** INITIAL  3  *** INITIAL  4  *** INITIAL     PLAN:  PT FREQUENCY/DURATION: ***  PLAN FOR NEXT SESSION: ***   [facility]  [address]  [place] [address]  [phone]  After Breast Cancer Class Video It is recommended you view the ABC class video to be educated on lymphedema risk reduction. This video lasts for about 30 minutes. It can be viewed on our website here: [email or web address]  Scar massage You can begin gentle scar massage to you incision sites. Gently place one hand on the incision and move the skin (without sliding on the skin) in various directions. Do this for a few minutes and then you can gently massage either coconut oil or vitamin E cream into the scars.  Compression garment You should continue wearing your compression bra until you feel like you no longer have swelling.  Home exercise Program Continue doing the exercises you were given until you feel like you can do them without feeling any tightness at the end.   Walking Program Studies show that 30 minutes of walking per day (fast enough to elevate your heart rate) can significantly reduce the risk of a cancer recurrence. If you can't walk due to other medical reasons, we encourage you to find another activity you could do (like a stationary bike or water exercise).  Posture After breast cancer surgery, people frequently sit with rounded shoulders posture because it puts their incisions on slack and feels better. If you sit like this  and scar tissue forms in that position, you can become very tight and have pain sitting or standing with good posture. Try to be aware of your posture and sit and stand up tall to heal properly.  Follow up PT: It is recommended you return every 3 months for the first 3 years following surgery to be assessed on the SOZO machine for an L-Dex score. This helps prevent clinically significant lymphedema in 95% of patients. These follow up screens are 10 minute appointments that you are not billed for.  Kippy Melena,MARTI COOPER, PT 01/02/2024, 9:16 PM

## 2024-01-03 ENCOUNTER — Encounter: Payer: Self-pay | Admitting: Rehabilitation

## 2024-01-03 ENCOUNTER — Ambulatory Visit: Attending: General Surgery | Admitting: Rehabilitation

## 2024-01-03 DIAGNOSIS — M6281 Muscle weakness (generalized): Secondary | ICD-10-CM | POA: Insufficient documentation

## 2024-01-03 DIAGNOSIS — Z9189 Other specified personal risk factors, not elsewhere classified: Secondary | ICD-10-CM | POA: Diagnosis not present

## 2024-01-03 DIAGNOSIS — Z483 Aftercare following surgery for neoplasm: Secondary | ICD-10-CM | POA: Diagnosis not present

## 2024-01-03 DIAGNOSIS — D0511 Intraductal carcinoma in situ of right breast: Secondary | ICD-10-CM | POA: Insufficient documentation

## 2024-01-03 DIAGNOSIS — R293 Abnormal posture: Secondary | ICD-10-CM | POA: Diagnosis not present

## 2024-01-03 NOTE — Therapy (Signed)
 OUTPATIENT PHYSICAL THERAPY BREAST CANCER POST OP FOLLOW UP   Patient Name: Stephanie Roth MRN: [id number] DOB:[birth year], 68 y.o., female Today's Date: 01/03/2024  END OF SESSION:  PT End of Session - 01/03/24 1048     Visit Number 2    Number of Visits 10    Date for PT Re-Evaluation 02/07/24    Authorization Type needed    PT Start Time 1015    PT Stop Time 1050    PT Time Calculation (min) 35 min    Activity Tolerance Patient tolerated treatment well    Behavior During Therapy [facility] for tasks assessed/performed          Past Medical History:  Diagnosis Date   Cancer (HCC) 02/2023   right breast DCIS   DUB (dysfunctional uterine bleeding)    Fibroids    uterine   Goiter 06/16/2007   Hypertension    Iron deficiency anemia    Joint pain    Menopause    Past Surgical History:  Procedure Laterality Date   blepheroplasty Bilateral    BREAST BIOPSY Right 02/08/2023   MM RT BREAST BX W LOC DEV 1ST LESION IMAGE BX SPEC STEREO GUIDE 02/08/2023 [facility]   BREAST BIOPSY Right 02/08/2023   MM RT BREAST BX W LOC DEV EA AD LESION IMG BX SPEC STEREO GUIDE 02/08/2023 [facility]   BREAST RECONSTRUCTION WITH PLACEMENT OF TISSUE EXPANDER AND FLEX HD (ACELLULAR HYDRATED DERMIS) Right 04/19/2023   Procedure: IMMEDIATE RIGHT BREAST RECONSTRUCTION WITH PLACEMENT OF TISSUE EXPANDER AND FLEX HD (ACELLULAR HYDRATED DERMIS);  Surgeon: Lowery Estefana RAMAN, DO;  Location: [facility];  Service: Plastics;  Laterality: Right;   BREAST REDUCTION SURGERY Left 09/16/2023   Procedure: BREAST REDUCTION WITH LIPOSUCTION;  Surgeon: Lowery Estefana RAMAN, DO;  Location: [facility];  Service: Plastics;  Laterality: Left;   CESAREAN SECTION     COLONOSCOPY     ECTOPIC PREGNANCY SURGERY     GUM SURGERY     MASTECTOMY W/ SENTINEL NODE BIOPSY Right 04/19/2023   Procedure: RIGHT MASTECTOMY AND SENTINEL NODE BIOPSY;  Surgeon: Curvin Deward MOULD, MD;  Location: [facility];  Service: General;   Laterality: Right;  PEC BLOCK   REMOVAL OF TISSUE EXPANDER AND PLACEMENT OF IMPLANT Right 09/16/2023   Procedure: REMOVAL, TISSUE EXPANDER, BREAST, WITH IMPLANT INSERTION;  Surgeon: Lowery Estefana RAMAN, DO;  Location: [facility];  Service: Plastics;  Laterality: Right;  right breast expander removal and placement of silicone implant, left breast reduction   TUBAL LIGATION     Patient Active Problem List   Diagnosis Date Noted   Breast cancer (HCC) 04/19/2023   Genetic testing 03/02/2023   Ductal carcinoma in situ (DCIS) of right breast 02/16/2023   History of hyperthyroidism 09/03/2010   Family history of diabetes mellitus 09/03/2010    REFERRING PROVIDER: Dr. Deward Curvin  REFERRING DIAG: Right breast cancer  THERAPY DIAG:  Ductal carcinoma in situ (DCIS) of right breast  Abnormal posture  Aftercare following surgery for neoplasm  Muscle weakness (generalized)  At risk for lymphedema  Rationale for Evaluation and Treatment: Rehabilitation  ONSET DATE: 09/16/2023  SUBJECTIVE:  SUBJECTIVE STATEMENT: Patient underwent a right mastectomy and sentinel node biopsy (9 negative nodes) on 04/19/2023. She had a right expander placed for breast reconstruction and a permanent implant placed and a left breast reduction surgery on 09/16/2023. No further cancer treatment is needed. I am doing well overall.  The main thing is that I am having some pain in the left shoulder but that was treated by a chiropractor and now the elbow as a little nerve related pain.  I maybe just feel a little weak.   PERTINENT HISTORY:  Patient was diagnosed on 01/20/2023 with right high grade DCIS. It measures 9.5 cm. It is ER/PR negative. 9 nodes removed.    PATIENT GOALS:  Reassess how my recovery is going related to arm  function, pain, and swelling.  PAIN:  Are you having pain? No  PRECAUTIONS: Recent Surgery, right UE Lymphedema risk, Other: recent breast reconstruction  RED FLAGS: None   ACTIVITY LEVEL / LEISURE: not lifting a lot.  I am not lifting my groceries my son will usually do it.     OBJECTIVE:   PATIENT SURVEYS:  QUICK DASH: 22.73%  OBSERVATIONS: Incisions healed well.  Rt nipple missing with horizontal incision and Lt incision around the nipple.  Wearing sports bras as needed.    POSTURE:  WNL.    LYMPHEDEMA ASSESSMENT:   UPPER EXTREMITY AROM/PROM:   A/PROM RIGHT   eval   RIGHT 01/02/2024  Shoulder extension 37 55  Shoulder flexion 152 152  Shoulder abduction 155 164  Shoulder internal rotation 51 65  Shoulder external rotation 89 90                          (Blank rows = not tested)   A/PROM LEFT   eval LEFT 01/02/2024  Shoulder extension 40 50  Shoulder flexion 136 145  Shoulder abduction 127 160  Shoulder internal rotation 59 70  Shoulder external rotation 78 80 - pn top of shoulder                           (Blank rows = not tested)   CERVICAL AROM: All within normal limits   UPPER EXTREMITY STRENGTH: UPPER EXTREMITY MMT:  MMT Right eval Left eval  Shoulder flexion 4 4  Shoulder extension    Shoulder abduction 4 4  Shoulder adduction    Shoulder extension    Shoulder internal rotation 4 4  Shoulder external rotation 4 4  Middle trapezius    Lower trapezius    Elbow flexion    Elbow extension    Wrist flexion    Wrist extension    Wrist ulnar deviation    Wrist radial deviation    Wrist pronation    Wrist supination    Grip strength 20# 20#- average is 40# for age   (Blank rows = not tested)    LYMPHEDEMA ASSESSMENTS (in cm):    LANDMARK RIGHT   eval RIGHT 01/02/2024  10 cm proximal to olecranon process 30.4   Olecranon process 23.6   10 cm proximal to ulnar styloid process 21.6   Just proximal to ulnar styloid process 14.6    Across hand at thumb web space 18.2   At base of 2nd digit 6.5   (Blank rows = not tested)   LANDMARK LEFT   eval LEFT 01/02/2024  10 cm proximal to olecranon process 30.5   Olecranon process 23.3  10 cm proximal to ulnar styloid process 20.8   Just proximal to ulnar styloid process 13.8   Across hand at thumb web space 17.7   At base of 2nd digit 6.3   (Blank rows = not tested)  L-DEX LYMPHEDEMA SCREENING Measurement Type: Unilateral L-DEX MEASUREMENT EXTREMITY: Upper Extremity POSITION : Standing DOMINANT SIDE: Right At Risk Side: Right BASELINE SCORE (UNILATERAL): -1.6 L-DEX SCORE (UNILATERAL): 0.6 VALUE CHANGE (UNILAT): 2.2    PATIENT EDUCATION:  Education details: post op education and POC Person educated: Patient Education method: Explanation Education comprehension: verbalized understanding  HOME EXERCISE PROGRAM: Reviewed previously given post op HEP.   ASSESSMENT:  CLINICAL IMPRESSION: Pt is doing very well post Rt mastectomy with implant and Left breast reduction.  Her initial surgery was 8 months ago so we re-did the SOZO and this was still green.  Restarted surveillance for this.  Her ROM is doing well but she has some mild Lt shoulder pain with end range flexion.  Her strength is WNL but pt feels weak with lifting groceries and carrying things lately.  We will start PT at this time to improve feelings of strength.   Pt will benefit from skilled therapeutic intervention to improve on the following deficits: Decreased knowledge of precautions, impaired UE functional use, pain, decreased ROM, postural dysfunction.   PT treatment/interventions: ADL/Self care home management, 97110-Therapeutic exercises, 97530- Therapeutic activity, [id number]- Neuromuscular re-education, 97535- Self Care, and 02859- Manual therapy   GOALS: Goals reviewed with patient? Yes  LONG TERM GOALS:  (STG=LTG)  GOALS Name Target Date  Goal status  1 Pt will demonstrate she has  regained full shoulder ROM and function post operatively compared to baselines.  Baseline: 01/03/24 MET  2 Pt will be educated on lymphedema risk and risk reduction 02/07/24 MET  3 Pt will report ability to carry in shopping bags from going to the grocery store 02/07/24 INITIAL  4 Pt will be ind with final HEP 02/07/24 INITIAL     PLAN:  PT FREQUENCY/DURATION: 1-2x per week x 4 weeks    PLAN FOR NEXT SESSION: bil shoulder postural strengthening, similar to strength ABC or scap series - has some left shoulder impingement.    [facility]  [address], [address]  [place] [address]  [phone]  After Breast Cancer Class Video It is recommended you view the ABC class video to be educated on lymphedema risk reduction. This video lasts for about 30 minutes. It can be viewed on our website here: [email or web address]  Scar massage You can begin gentle scar massage to you incision sites. Gently place one hand on the incision and move the skin (without sliding on the skin) in various directions. Do this for a few minutes and then you can gently massage either coconut oil or vitamin E cream into the scars.  Compression garment You should continue wearing your compression bra until you feel like you no longer have swelling.  Home exercise Program Continue doing the exercises you were given until you feel like you can do them without feeling any tightness at the end.   Walking Program Studies show that 30 minutes of walking per day (fast enough to elevate your heart rate) can significantly reduce the risk of a cancer recurrence. If you can't walk due to other medical reasons, we encourage you to find another activity you could do (like a stationary bike or water exercise).  Posture After breast cancer surgery, people frequently sit with rounded shoulders posture  because it puts their incisions on slack  and feels better. If you sit like this and scar tissue forms in that position, you can become very tight and have pain sitting or standing with good posture. Try to be aware of your posture and sit and stand up tall to heal properly.  Follow up PT: It is recommended you return every 3 months for the first 3 years following surgery to be assessed on the SOZO machine for an L-Dex score. This helps prevent clinically significant lymphedema in 95% of patients. These follow up screens are 10 minute appointments that you are not billed for.  Larue Saddie SAUNDERS, PT 01/03/2024, 10:51 AM

## 2024-01-12 ENCOUNTER — Encounter: Payer: Self-pay | Admitting: Rehabilitation

## 2024-01-12 ENCOUNTER — Ambulatory Visit: Admitting: Rehabilitation

## 2024-01-12 DIAGNOSIS — M6281 Muscle weakness (generalized): Secondary | ICD-10-CM

## 2024-01-12 DIAGNOSIS — R293 Abnormal posture: Secondary | ICD-10-CM

## 2024-01-12 DIAGNOSIS — D0511 Intraductal carcinoma in situ of right breast: Secondary | ICD-10-CM | POA: Diagnosis not present

## 2024-01-12 DIAGNOSIS — Z9189 Other specified personal risk factors, not elsewhere classified: Secondary | ICD-10-CM

## 2024-01-12 DIAGNOSIS — Z483 Aftercare following surgery for neoplasm: Secondary | ICD-10-CM

## 2024-01-12 NOTE — Therapy (Signed)
 OUTPATIENT PHYSICAL THERAPY BREAST CANCER POST OP FOLLOW UP   Patient Name: Stephanie Roth MRN: [id number] DOB:July 19, 1955, 68 y.o., female Today's Date: 01/12/2024  END OF SESSION:  PT End of Session - 01/12/24 1257     Visit Number 3    Number of Visits 10    Date for PT Re-Evaluation 02/07/24    Authorization Type 9 visits 01/03/24-01/31/24    Authorization - Visit Number 2    Authorization - Number of Visits 9    PT Start Time 1300    PT Stop Time 1333    PT Time Calculation (min) 33 min    Activity Tolerance Patient tolerated treatment well    Behavior During Therapy [facility] for tasks assessed/performed          Past Medical History:  Diagnosis Date   Cancer (HCC) 02/2023   right breast DCIS   DUB (dysfunctional uterine bleeding)    Fibroids    uterine   Goiter 06/16/2007   Hypertension    Iron deficiency anemia    Joint pain    Menopause    Past Surgical History:  Procedure Laterality Date   blepheroplasty Bilateral    BREAST BIOPSY Right 02/08/2023   MM RT BREAST BX W LOC DEV 1ST LESION IMAGE BX SPEC STEREO GUIDE 02/08/2023 [facility]   BREAST BIOPSY Right 02/08/2023   MM RT BREAST BX W LOC DEV EA AD LESION IMG BX SPEC STEREO GUIDE 02/08/2023 [facility]   BREAST RECONSTRUCTION WITH PLACEMENT OF TISSUE EXPANDER AND FLEX HD (ACELLULAR HYDRATED DERMIS) Right 04/19/2023   Procedure: IMMEDIATE RIGHT BREAST RECONSTRUCTION WITH PLACEMENT OF TISSUE EXPANDER AND FLEX HD (ACELLULAR HYDRATED DERMIS);  Surgeon: Lowery Estefana RAMAN, DO;  Location: [facility];  Service: Plastics;  Laterality: Right;   BREAST REDUCTION SURGERY Left 09/16/2023   Procedure: BREAST REDUCTION WITH LIPOSUCTION;  Surgeon: Lowery Estefana RAMAN, DO;  Location: [facility];  Service: Plastics;  Laterality: Left;   CESAREAN SECTION     COLONOSCOPY     ECTOPIC PREGNANCY SURGERY     GUM SURGERY     MASTECTOMY W/ SENTINEL NODE BIOPSY Right 04/19/2023   Procedure: RIGHT MASTECTOMY AND  SENTINEL NODE BIOPSY;  Surgeon: Curvin Deward MOULD, MD;  Location: [facility];  Service: General;  Laterality: Right;  PEC BLOCK   REMOVAL OF TISSUE EXPANDER AND PLACEMENT OF IMPLANT Right 09/16/2023   Procedure: REMOVAL, TISSUE EXPANDER, BREAST, WITH IMPLANT INSERTION;  Surgeon: Lowery Estefana RAMAN, DO;  Location: [facility];  Service: Plastics;  Laterality: Right;  right breast expander removal and placement of silicone implant, left breast reduction   TUBAL LIGATION     Patient Active Problem List   Diagnosis Date Noted   Breast cancer (HCC) 04/19/2023   Genetic testing 03/02/2023   Ductal carcinoma in situ (DCIS) of right breast 02/16/2023   History of hyperthyroidism 09/03/2010   Family history of diabetes mellitus 09/03/2010    REFERRING PROVIDER: Dr. Deward Curvin  REFERRING DIAG: Right breast cancer  THERAPY DIAG:  Ductal carcinoma in situ (DCIS) of right breast  Abnormal posture  Aftercare following surgery for neoplasm  Muscle weakness (generalized)  At risk for lymphedema  Rationale for Evaluation and Treatment: Rehabilitation  ONSET DATE: 09/16/2023  SUBJECTIVE:  SUBJECTIVE STATEMENT: No arm pain lately   PERTINENT HISTORY:  Patient was diagnosed on 01/20/2023 with right high grade DCIS. It measures 9.5 cm. It is ER/PR negative. 9 nodes removed.    PATIENT GOALS:  Reassess how my recovery is going related to arm function, pain, and swelling.  PAIN:  Are you having pain? No  PRECAUTIONS: Recent Surgery, right UE Lymphedema risk, Other: recent breast reconstruction  RED FLAGS: None   ACTIVITY LEVEL / LEISURE: not lifting a lot.  I am not lifting my groceries my son will usually do it.     OBJECTIVE:   PATIENT SURVEYS:  QUICK DASH: 22.73%  OBSERVATIONS: Incisions  healed well.  Rt nipple missing with horizontal incision and Lt incision around the nipple.  Wearing sports bras as needed.    POSTURE:  WNL.    LYMPHEDEMA ASSESSMENT:   UPPER EXTREMITY AROM/PROM:   A/PROM RIGHT   eval   RIGHT 01/02/2024  Shoulder extension 37 55  Shoulder flexion 152 152  Shoulder abduction 155 164  Shoulder internal rotation 51 65  Shoulder external rotation 89 90                          (Blank rows = not tested)   A/PROM LEFT   eval LEFT 01/02/2024  Shoulder extension 40 50  Shoulder flexion 136 145  Shoulder abduction 127 160  Shoulder internal rotation 59 70  Shoulder external rotation 78 80 - pn top of shoulder                           (Blank rows = not tested)   CERVICAL AROM: All within normal limits   UPPER EXTREMITY STRENGTH: UPPER EXTREMITY MMT:  MMT Right eval Left eval  Shoulder flexion 4 4  Shoulder extension    Shoulder abduction 4 4  Shoulder adduction    Shoulder extension    Shoulder internal rotation 4 4  Shoulder external rotation 4 4  Middle trapezius    Lower trapezius    Elbow flexion    Elbow extension    Wrist flexion    Wrist extension    Wrist ulnar deviation    Wrist radial deviation    Wrist pronation    Wrist supination    Grip strength 20# 20#- average is 40# for age   (Blank rows = not tested)    LYMPHEDEMA ASSESSMENTS (in cm):    LANDMARK RIGHT   eval RIGHT 01/02/2024  10 cm proximal to olecranon process 30.4   Olecranon process 23.6   10 cm proximal to ulnar styloid process 21.6   Just proximal to ulnar styloid process 14.6   Across hand at thumb web space 18.2   At base of 2nd digit 6.5   (Blank rows = not tested)   LANDMARK LEFT   eval LEFT 01/02/2024  10 cm proximal to olecranon process 30.5   Olecranon process 23.3   10 cm proximal to ulnar styloid process 20.8   Just proximal to ulnar styloid process 13.8   Across hand at thumb web space 17.7   At base of 2nd digit 6.3   (Blank rows  = not tested)  L-DEX LYMPHEDEMA SCREENING Measurement Type: Unilateral L-DEX MEASUREMENT EXTREMITY: Upper Extremity POSITION : Standing DOMINANT SIDE: Right At Risk Side: Right BASELINE SCORE (UNILATERAL): -1.6 L-DEX SCORE (UNILATERAL): 0.6 VALUE CHANGE (UNILAT): 2.2  TODAYS TREATMENT 01/12/24 Pulleys into flexion  x (easy overall)  Ball roll into flexion x 5 with 5 holds - feeling left lat stretch Back against wall flexion 2# x 10 Bicep curls 2# x 10 Yellow band standing  rows x 10    Extension bil x 10    Bil ER x 10 Supine Yellow band  Horizontal abduction x 10     Diagonals x 10 bil      PATIENT EDUCATION:  Education details: post op education and POC Person educated: Patient Education method: Explanation Education comprehension: verbalized understanding  HOME EXERCISE PROGRAM: Reviewed previously given post op HEP. Access Code: [email or web address] URL: [email or web address] Date: 01/12/2024 Prepared by: Saddie Raw  Exercises - Standing Shoulder Flexion to 90 Degrees with Dumbbells  - 1 x daily - 2-3 x weekly - 1 sets - 10 reps - no hold - Standing Bicep Curls Supinated with Dumbbells  - 1 x daily - 2-3 x weekly - 1 sets - 10 reps - no hold - Standing Row with Anchored Resistance  - 1 x daily - 2-3 x weekly - 1 sets - 10 reps - no hold - Shoulder Extension with Resistance  - 1 x daily - 2-3 x weekly - 1 sets - 10 reps - no hold - Standing Shoulder External Rotation with Resistance  - 1 x daily - 2-3 x weekly - 1 sets - 10 reps - no hold - Supine Shoulder Horizontal Abduction with Resistance  - 1 x daily - 2-3 x weekly - 1 sets - 10 reps - no hold   ASSESSMENT:  CLINICAL IMPRESSION: Started resistance and band exercises today which were tolerated all well.  Updated HEP.    Pt will benefit from skilled therapeutic intervention to improve on the following deficits: Decreased knowledge of precautions, impaired UE functional use, pain, decreased ROM,  postural dysfunction.   PT treatment/interventions: ADL/Self care home management, 97110-Therapeutic exercises, 97530- Therapeutic activity, [id number]- Neuromuscular re-education, 97535- Self Care, and 02859- Manual therapy   GOALS: Goals reviewed with patient? Yes  LONG TERM GOALS:  (STG=LTG)  GOALS Name Target Date  Goal status  1 Pt will demonstrate she has regained full shoulder ROM and function post operatively compared to baselines.  Baseline: 01/03/24 MET  2 Pt will be educated on lymphedema risk and risk reduction 02/07/24 MET  3 Pt will report ability to carry in shopping bags from going to the grocery store 02/07/24 INITIAL  4 Pt will be ind with final HEP 02/07/24 INITIAL     PLAN:  PT FREQUENCY/DURATION: 1-2x per week x 4 weeks    PLAN FOR NEXT SESSION: bil shoulder postural strengthening, similar to strength ABC or scap series - has some left shoulder impingement.    [facility]  [address]  [place] [address]  [phone]  After Breast Cancer Class Video It is recommended you view the ABC class video to be educated on lymphedema risk reduction. This video lasts for about 30 minutes. It can be viewed on our website here: [email or web address]  Scar massage You can begin gentle scar massage to you incision sites. Gently place one hand on the incision and move the skin (without sliding on the skin) in various directions. Do this for a few minutes and then you can gently massage either coconut oil or vitamin E cream into the scars.  Compression garment You should continue wearing your compression bra until you feel like you no longer have swelling.  Home exercise Program Continue doing  the exercises you were given until you feel like you can do them without feeling any tightness at the end.   Walking Program Studies show that 30 minutes of walking per day (fast enough  to elevate your heart rate) can significantly reduce the risk of a cancer recurrence. If you can't walk due to other medical reasons, we encourage you to find another activity you could do (like a stationary bike or water exercise).  Posture After breast cancer surgery, people frequently sit with rounded shoulders posture because it puts their incisions on slack and feels better. If you sit like this and scar tissue forms in that position, you can become very tight and have pain sitting or standing with good posture. Try to be aware of your posture and sit and stand up tall to heal properly.  Follow up PT: It is recommended you return every 3 months for the first 3 years following surgery to be assessed on the SOZO machine for an L-Dex score. This helps prevent clinically significant lymphedema in 95% of patients. These follow up screens are 10 minute appointments that you are not billed for.  Larue Saddie SAUNDERS, PT 01/12/2024, 2:16 PM

## 2024-01-14 ENCOUNTER — Ambulatory Visit: Attending: General Surgery

## 2024-01-14 DIAGNOSIS — Z483 Aftercare following surgery for neoplasm: Secondary | ICD-10-CM | POA: Insufficient documentation

## 2024-01-14 DIAGNOSIS — R293 Abnormal posture: Secondary | ICD-10-CM | POA: Insufficient documentation

## 2024-01-14 DIAGNOSIS — Z9189 Other specified personal risk factors, not elsewhere classified: Secondary | ICD-10-CM | POA: Diagnosis not present

## 2024-01-14 DIAGNOSIS — M6281 Muscle weakness (generalized): Secondary | ICD-10-CM | POA: Insufficient documentation

## 2024-01-14 DIAGNOSIS — D0511 Intraductal carcinoma in situ of right breast: Secondary | ICD-10-CM | POA: Diagnosis not present

## 2024-01-14 NOTE — Therapy (Signed)
 OUTPATIENT PHYSICAL THERAPY BREAST CANCER TREATMENT   Patient Name: Stephanie Roth MRN: [id number] DOB:03-Jan-1956, 68 y.o., female Today's Date: 01/14/2024  END OF SESSION:  PT End of Session - 01/14/24 1017     Visit Number 4    Number of Visits 10    Date for PT Re-Evaluation 02/07/24    Authorization Type 9 visits 01/03/24-01/31/24    Authorization - Visit Number 3    Authorization - Number of Visits 9    PT Start Time 1008   arrived late   PT Stop Time 1055    PT Time Calculation (min) 47 min    Activity Tolerance Patient tolerated treatment well    Behavior During Therapy [facility] for tasks assessed/performed          Past Medical History:  Diagnosis Date   Cancer (HCC) 02/2023   right breast DCIS   DUB (dysfunctional uterine bleeding)    Fibroids    uterine   Goiter 06/16/2007   Hypertension    Iron deficiency anemia    Joint pain    Menopause    Past Surgical History:  Procedure Laterality Date   blepheroplasty Bilateral    BREAST BIOPSY Right 02/08/2023   MM RT BREAST BX W LOC DEV 1ST LESION IMAGE BX SPEC STEREO GUIDE 02/08/2023 [facility]   BREAST BIOPSY Right 02/08/2023   MM RT BREAST BX W LOC DEV EA AD LESION IMG BX SPEC STEREO GUIDE 02/08/2023 [facility]   BREAST RECONSTRUCTION WITH PLACEMENT OF TISSUE EXPANDER AND FLEX HD (ACELLULAR HYDRATED DERMIS) Right 04/19/2023   Procedure: IMMEDIATE RIGHT BREAST RECONSTRUCTION WITH PLACEMENT OF TISSUE EXPANDER AND FLEX HD (ACELLULAR HYDRATED DERMIS);  Surgeon: Lowery Estefana RAMAN, DO;  Location: [facility];  Service: Plastics;  Laterality: Right;   BREAST REDUCTION SURGERY Left 09/16/2023   Procedure: BREAST REDUCTION WITH LIPOSUCTION;  Surgeon: Lowery Estefana RAMAN, DO;  Location: [facility];  Service: Plastics;  Laterality: Left;   CESAREAN SECTION     COLONOSCOPY     ECTOPIC PREGNANCY SURGERY     GUM SURGERY     MASTECTOMY W/ SENTINEL NODE BIOPSY Right 04/19/2023   Procedure: RIGHT MASTECTOMY  AND SENTINEL NODE BIOPSY;  Surgeon: Curvin Deward MOULD, MD;  Location: [facility];  Service: General;  Laterality: Right;  PEC BLOCK   REMOVAL OF TISSUE EXPANDER AND PLACEMENT OF IMPLANT Right 09/16/2023   Procedure: REMOVAL, TISSUE EXPANDER, BREAST, WITH IMPLANT INSERTION;  Surgeon: Lowery Estefana RAMAN, DO;  Location: [facility];  Service: Plastics;  Laterality: Right;  right breast expander removal and placement of silicone implant, left breast reduction   TUBAL LIGATION     Patient Active Problem List   Diagnosis Date Noted   Breast cancer (HCC) 04/19/2023   Genetic testing 03/02/2023   Ductal carcinoma in situ (DCIS) of right breast 02/16/2023   History of hyperthyroidism 09/03/2010   Family history of diabetes mellitus 09/03/2010    REFERRING PROVIDER: Dr. Deward Curvin  REFERRING DIAG: Right breast cancer  THERAPY DIAG:  Ductal carcinoma in situ (DCIS) of right breast  Abnormal posture  Aftercare following surgery for neoplasm  Muscle weakness (generalized)  At risk for lymphedema  Rationale for Evaluation and Treatment: Rehabilitation  ONSET DATE: 09/16/2023  SUBJECTIVE:  SUBJECTIVE STATEMENT: No changes since I was here last. I just want to feel like I have my strength back.   PERTINENT HISTORY:  Patient was diagnosed on 01/20/2023 with right high grade DCIS. It measures 9.5 cm. It is ER/PR negative. 9 nodes removed.    PATIENT GOALS:  Reassess how my recovery is going related to arm function, pain, and swelling.  PAIN:  Are you having pain? No  PRECAUTIONS: Recent Surgery, right UE Lymphedema risk, Other: recent breast reconstruction  RED FLAGS: None   ACTIVITY LEVEL / LEISURE: not lifting a lot.  I am not lifting my groceries my son will usually do it.     OBJECTIVE:    PATIENT SURVEYS:  QUICK DASH: 22.73%  OBSERVATIONS: Incisions healed well.  Rt nipple missing with horizontal incision and Lt incision around the nipple.  Wearing sports bras as needed.    POSTURE:  WNL.    LYMPHEDEMA ASSESSMENT:   UPPER EXTREMITY AROM/PROM:   A/PROM RIGHT   eval   RIGHT 01/02/2024  Shoulder extension 37 55  Shoulder flexion 152 152  Shoulder abduction 155 164  Shoulder internal rotation 51 65  Shoulder external rotation 89 90                          (Blank rows = not tested)   A/PROM LEFT   eval LEFT 01/02/2024  Shoulder extension 40 50  Shoulder flexion 136 145  Shoulder abduction 127 160  Shoulder internal rotation 59 70  Shoulder external rotation 78 80 - pn top of shoulder                           (Blank rows = not tested)   CERVICAL AROM: All within normal limits   UPPER EXTREMITY STRENGTH: UPPER EXTREMITY MMT:  MMT Right eval Left eval  Shoulder flexion 4 4  Shoulder extension    Shoulder abduction 4 4  Shoulder adduction    Shoulder extension    Shoulder internal rotation 4 4  Shoulder external rotation 4 4  Middle trapezius    Lower trapezius    Elbow flexion    Elbow extension    Wrist flexion    Wrist extension    Wrist ulnar deviation    Wrist radial deviation    Wrist pronation    Wrist supination    Grip strength 20# 20#- average is 40# for age   (Blank rows = not tested)    LYMPHEDEMA ASSESSMENTS (in cm):    LANDMARK RIGHT   eval RIGHT 01/02/2024  10 cm proximal to olecranon process 30.4   Olecranon process 23.6   10 cm proximal to ulnar styloid process 21.6   Just proximal to ulnar styloid process 14.6   Across hand at thumb web space 18.2   At base of 2nd digit 6.5   (Blank rows = not tested)   LANDMARK LEFT   eval LEFT 01/02/2024  10 cm proximal to olecranon process 30.5   Olecranon process 23.3   10 cm proximal to ulnar styloid process 20.8   Just proximal to ulnar styloid process 13.8   Across  hand at thumb web space 17.7   At base of 2nd digit 6.3   (Blank rows = not tested)  L-DEX LYMPHEDEMA SCREENING Measurement Type: Unilateral L-DEX MEASUREMENT EXTREMITY: Upper Extremity POSITION : Standing DOMINANT SIDE: Right At Risk Side: Right BASELINE SCORE (UNILATERAL): -1.6 L-DEX  SCORE (UNILATERAL): 0.6 VALUE CHANGE (UNILAT): 2.2  TODAYS TREATMENT 01/14/24: Self Care  Explained Strength ABC Program to pt, how this can further benefit this stage of her recovery, and educated her on the low and slow process of progressing weights and resistance before beginning Strength ABC Program this morning.  Therapeutic Activities Educated pt in Strength ABC Program having her return demo of each and demo prn. Adjusted technique during and pt was able to return good demo of each by end of session. Used 3# dumbbells as pt reports weights were easy at last session.   01/12/24 Pulleys into flexion x (easy overall)  Ball roll into flexion x 5 with 5 holds - feeling left lat stretch Back against wall flexion 2# x 10 Bicep curls 2# x 10 Yellow band standing  rows x 10    Extension bil x 10    Bil ER x 10 Supine Yellow band  Horizontal abduction x 10     Diagonals x 10 bil      PATIENT EDUCATION:  Education details: Artist Person educated: Patient Education method: Explanation, demonstration, tactile and VC's and handout issued with flowsheet Education comprehension: verbalized understanding, returned demo, cuing for correct technique and pt will benefit from further review  HOME EXERCISE PROGRAM: Reviewed previously given post op HEP. Access Code: [email or web address] URL: [email or web address] Date: 01/12/2024 Prepared by: Saddie Raw  Exercises - Standing Shoulder Flexion to 90 Degrees with Dumbbells  - 1 x daily - 2-3 x weekly - 1 sets - 10 reps - no hold - Standing Bicep Curls Supinated with Dumbbells  - 1 x daily - 2-3 x weekly - 1 sets - 10 reps - no  hold - Standing Row with Anchored Resistance  - 1 x daily - 2-3 x weekly - 1 sets - 10 reps - no hold - Shoulder Extension with Resistance  - 1 x daily - 2-3 x weekly - 1 sets - 10 reps - no hold - Standing Shoulder External Rotation with Resistance  - 1 x daily - 2-3 x weekly - 1 sets - 10 reps - no hold - Supine Shoulder Horizontal Abduction with Resistance  - 1 x daily - 2-3 x weekly - 1 sets - 10 reps - no hold  01/14/24: Strength ABC Program  ASSESSMENT:  CLINICAL IMPRESSION: Instructed pt in Strength ABC Program having her return demo of each exercise today. She reports feeling challenged by these and benefited from cuing throughout for correct technique and muscle engagement along with therapist demo's.   Pt will benefit from skilled therapeutic intervention to improve on the following deficits: Decreased knowledge of precautions, impaired UE functional use, pain, decreased ROM, postural dysfunction.   PT treatment/interventions: ADL/Self care home management, 97110-Therapeutic exercises, 97530- Therapeutic activity, [id number]- Neuromuscular re-education, 97535- Self Care, and 02859- Manual therapy   GOALS: Goals reviewed with patient? Yes  LONG TERM GOALS:  (STG=LTG)  GOALS Name Target Date  Goal status  1 Pt will demonstrate she has regained full shoulder ROM and function post operatively compared to baselines.  Baseline: 01/03/24 MET  2 Pt will be educated on lymphedema risk and risk reduction 02/07/24 MET  3 Pt will report ability to carry in shopping bags from going to the grocery store 02/07/24 INITIAL  4 Pt will be ind with final HEP 02/07/24 INITIAL     PLAN:  PT FREQUENCY/DURATION: 1-2x per week x 4 weeks    PLAN FOR NEXT SESSION: bil shoulder postural strengthening, review  strength ABC, need scap series? - has some left shoulder impingement.    [facility]  [address]  [place] [address]  [phone]    Aden Berwyn Caldron, PTA 01/14/2024, 11:04 AM

## 2024-01-17 ENCOUNTER — Ambulatory Visit: Admitting: Rehabilitation

## 2024-01-19 ENCOUNTER — Ambulatory Visit: Admitting: Rehabilitation

## 2024-01-19 ENCOUNTER — Encounter: Payer: Self-pay | Admitting: Rehabilitation

## 2024-01-19 DIAGNOSIS — Z483 Aftercare following surgery for neoplasm: Secondary | ICD-10-CM

## 2024-01-19 DIAGNOSIS — R293 Abnormal posture: Secondary | ICD-10-CM

## 2024-01-19 DIAGNOSIS — D0511 Intraductal carcinoma in situ of right breast: Secondary | ICD-10-CM

## 2024-01-19 DIAGNOSIS — M6281 Muscle weakness (generalized): Secondary | ICD-10-CM | POA: Diagnosis not present

## 2024-01-19 DIAGNOSIS — Z9189 Other specified personal risk factors, not elsewhere classified: Secondary | ICD-10-CM | POA: Diagnosis not present

## 2024-01-19 NOTE — Therapy (Signed)
 OUTPATIENT PHYSICAL THERAPY BREAST CANCER TREATMENT   Patient Name: Stephanie Roth MRN: [id number] DOB:[birth year], 68 y.o., female Today's Date: 01/19/2024  END OF SESSION:  PT End of Session - 01/19/24 1550     Visit Number 5    Number of Visits 10    Date for PT Re-Evaluation 02/07/24    Authorization Type 9 visits 01/03/24-01/31/24    Authorization - Visit Number 4    Authorization - Number of Visits 9    PT Start Time 1500    PT Stop Time 1545    PT Time Calculation (min) 45 min    Activity Tolerance Patient tolerated treatment well    Behavior During Therapy [facility] for tasks assessed/performed           Past Medical History:  Diagnosis Date   Cancer (HCC) 02/2023   right breast DCIS   DUB (dysfunctional uterine bleeding)    Fibroids    uterine   Goiter 06/16/2007   Hypertension    Iron deficiency anemia    Joint pain    Menopause    Past Surgical History:  Procedure Laterality Date   blepheroplasty Bilateral    BREAST BIOPSY Right 02/08/2023   MM RT BREAST BX W LOC DEV 1ST LESION IMAGE BX SPEC STEREO GUIDE 02/08/2023 [facility]   BREAST BIOPSY Right 02/08/2023   MM RT BREAST BX W LOC DEV EA AD LESION IMG BX SPEC STEREO GUIDE 02/08/2023 [facility]   BREAST RECONSTRUCTION WITH PLACEMENT OF TISSUE EXPANDER AND FLEX HD (ACELLULAR HYDRATED DERMIS) Right 04/19/2023   Procedure: IMMEDIATE RIGHT BREAST RECONSTRUCTION WITH PLACEMENT OF TISSUE EXPANDER AND FLEX HD (ACELLULAR HYDRATED DERMIS);  Surgeon: Lowery Estefana RAMAN, DO;  Location: [facility];  Service: Plastics;  Laterality: Right;   BREAST REDUCTION SURGERY Left 09/16/2023   Procedure: BREAST REDUCTION WITH LIPOSUCTION;  Surgeon: Lowery Estefana RAMAN, DO;  Location: [facility];  Service: Plastics;  Laterality: Left;   CESAREAN SECTION     COLONOSCOPY     ECTOPIC PREGNANCY SURGERY     GUM SURGERY     MASTECTOMY W/ SENTINEL NODE BIOPSY Right 04/19/2023   Procedure: RIGHT MASTECTOMY AND  SENTINEL NODE BIOPSY;  Surgeon: Curvin Deward MOULD, MD;  Location: [facility];  Service: General;  Laterality: Right;  PEC BLOCK   REMOVAL OF TISSUE EXPANDER AND PLACEMENT OF IMPLANT Right 09/16/2023   Procedure: REMOVAL, TISSUE EXPANDER, BREAST, WITH IMPLANT INSERTION;  Surgeon: Lowery Estefana RAMAN, DO;  Location: [facility];  Service: Plastics;  Laterality: Right;  right breast expander removal and placement of silicone implant, left breast reduction   TUBAL LIGATION     Patient Active Problem List   Diagnosis Date Noted   Breast cancer (HCC) 04/19/2023   Genetic testing 03/02/2023   Ductal carcinoma in situ (DCIS) of right breast 02/16/2023   History of hyperthyroidism 09/03/2010   Family history of diabetes mellitus 09/03/2010    REFERRING PROVIDER: Dr. Deward Curvin  REFERRING DIAG: Right breast cancer  THERAPY DIAG:  Abnormal posture  Ductal carcinoma in situ (DCIS) of right breast  Aftercare following surgery for neoplasm  Muscle weakness (generalized)  At risk for lymphedema  Rationale for Evaluation and Treatment: Rehabilitation  ONSET DATE: 09/16/2023  SUBJECTIVE:  SUBJECTIVE STATEMENT: There is one I think I'm not doing right.   PERTINENT HISTORY:  Patient was diagnosed on 01/20/2023 with right high grade DCIS. It measures 9.5 cm. It is ER/PR negative. 9 nodes removed.    PATIENT GOALS:  Reassess how my recovery is going related to arm function, pain, and swelling.  PAIN:  Are you having pain? No  PRECAUTIONS: Recent Surgery, right UE Lymphedema risk, Other: recent breast reconstruction  RED FLAGS: None   ACTIVITY LEVEL / LEISURE: not lifting a lot.  I am not lifting my groceries my son will usually do it.     OBJECTIVE:   PATIENT SURVEYS:  QUICK  DASH: 22.73%  OBSERVATIONS: Incisions healed well.  Rt nipple missing with horizontal incision and Lt incision around the nipple.  Wearing sports bras as needed.    POSTURE:  WNL.    LYMPHEDEMA ASSESSMENT:   UPPER EXTREMITY AROM/PROM:   A/PROM RIGHT   eval   RIGHT 01/02/2024  Shoulder extension 37 55  Shoulder flexion 152 152  Shoulder abduction 155 164  Shoulder internal rotation 51 65  Shoulder external rotation 89 90                          (Blank rows = not tested)   A/PROM LEFT   eval LEFT 01/02/2024  Shoulder extension 40 50  Shoulder flexion 136 145  Shoulder abduction 127 160  Shoulder internal rotation 59 70  Shoulder external rotation 78 80 - pn top of shoulder                           (Blank rows = not tested)   CERVICAL AROM: All within normal limits   UPPER EXTREMITY STRENGTH: UPPER EXTREMITY MMT:  MMT Right eval Left eval  Shoulder flexion 4 4  Shoulder extension    Shoulder abduction 4 4  Shoulder adduction    Shoulder extension    Shoulder internal rotation 4 4  Shoulder external rotation 4 4  Middle trapezius    Lower trapezius    Elbow flexion    Elbow extension    Wrist flexion    Wrist extension    Wrist ulnar deviation    Wrist radial deviation    Wrist pronation    Wrist supination    Grip strength 20# 20#- average is 40# for age   (Blank rows = not tested)    LYMPHEDEMA ASSESSMENTS (in cm):    LANDMARK RIGHT   eval RIGHT 01/02/2024  10 cm proximal to olecranon process 30.4   Olecranon process 23.6   10 cm proximal to ulnar styloid process 21.6   Just proximal to ulnar styloid process 14.6   Across hand at thumb web space 18.2   At base of 2nd digit 6.5   (Blank rows = not tested)   LANDMARK LEFT   eval LEFT 01/02/2024  10 cm proximal to olecranon process 30.5   Olecranon process 23.3   10 cm proximal to ulnar styloid process 20.8   Just proximal to ulnar styloid process 13.8   Across hand at thumb web space 17.7    At base of 2nd digit 6.3   (Blank rows = not tested)  L-DEX LYMPHEDEMA SCREENING Measurement Type: Unilateral L-DEX MEASUREMENT EXTREMITY: Upper Extremity POSITION : Standing DOMINANT SIDE: Right At Risk Side: Right BASELINE SCORE (UNILATERAL): -1.6 L-DEX SCORE (UNILATERAL): 0.6 VALUE CHANGE (UNILAT): 2.2  TODAYS  TREATMENT 01/19/24: Therapeutic Exercise Strength ABC Program having her return demo of each and Adjusted technique if needed.Used 3# dumbbells for each weighted exercise. Pt performed very well but was not doing the dead lifts correctly and felt more comfortable with practice.    01/14/24: Self Care  Explained Strength ABC Program to pt, how this can further benefit this stage of her recovery, and educated her on the low and slow process of progressing weights and resistance before beginning Strength ABC Program this morning.  Therapeutic Activities Educated pt in Strength ABC Program having her return demo of each and demo prn. Adjusted technique during and pt was able to return good demo of each by end of session. Used 3# dumbbells as pt reports weights were easy at last session.   01/12/24 Pulleys into flexion x (easy overall)  Ball roll into flexion x 5 with 5 holds - feeling left lat stretch Back against wall flexion 2# x 10 Bicep curls 2# x 10 Yellow band standing  rows x 10    Extension bil x 10    Bil ER x 10 Supine Yellow band  Horizontal abduction x 10     Diagonals x 10 bil      PATIENT EDUCATION:  Education details: Artist Person educated: Patient Education method: Explanation, demonstration, tactile and VC's and handout issued with flowsheet Education comprehension: verbalized understanding, returned demo, cuing for correct technique and pt will benefit from further review  HOME EXERCISE PROGRAM: Reviewed previously given post op HEP. Access Code: [email or web address] URL: [email or web address] Date: 01/12/2024 Prepared by:  Saddie Raw  Exercises - Standing Shoulder Flexion to 90 Degrees with Dumbbells  - 1 x daily - 2-3 x weekly - 1 sets - 10 reps - no hold - Standing Bicep Curls Supinated with Dumbbells  - 1 x daily - 2-3 x weekly - 1 sets - 10 reps - no hold - Standing Row with Anchored Resistance  - 1 x daily - 2-3 x weekly - 1 sets - 10 reps - no hold - Shoulder Extension with Resistance  - 1 x daily - 2-3 x weekly - 1 sets - 10 reps - no hold - Standing Shoulder External Rotation with Resistance  - 1 x daily - 2-3 x weekly - 1 sets - 10 reps - no hold - Supine Shoulder Horizontal Abduction with Resistance  - 1 x daily - 2-3 x weekly - 1 sets - 10 reps - no hold  01/14/24: Strength ABC Program  ASSESSMENT:  CLINICAL IMPRESSION: Continued Strength ABC Program having her return demo of each exercise today. She has been doing well with them at home and even using the tracking sheet and has been doing 3 sets of 10 with 3# for each.  She liked doing the stretches not at the wall but just seated/standing and felt better about the dead lifts after demo.  She would be ready for DC with HEP today but we decided to do 1 more visit to make sure she feels like she does the dead lifts correctly.   Pt will benefit from skilled therapeutic intervention to improve on the following deficits: Decreased knowledge of precautions, impaired UE functional use, pain, decreased ROM, postural dysfunction.   PT treatment/interventions: ADL/Self care home management, 97110-Therapeutic exercises, 97530- Therapeutic activity, [id number]- Neuromuscular re-education, 97535- Self Care, and 02859- Manual therapy   GOALS: Goals reviewed with patient? Yes  LONG TERM GOALS:  (STG=LTG)  GOALS Name Target Date  Goal status  1 Pt will demonstrate she has regained full shoulder ROM and function post operatively compared to baselines.  Baseline: 01/03/24 MET  2 Pt will be educated on lymphedema risk and risk reduction 02/07/24 MET  3 Pt will report  ability to carry in shopping bags from going to the grocery store 02/07/24 INITIAL  4 Pt will be ind with final HEP 02/07/24 INITIAL     PLAN:  PT FREQUENCY/DURATION: 1-2x per week x 4 weeks    PLAN FOR NEXT SESSION: bil shoulder postural strengthening, review strength ABC, need scap series? - has some left shoulder impingement.    [facility]  [address], [address]  [place] [address]  [phone]    Larue Saddie SAUNDERS, PT 01/19/2024, 3:51 PM

## 2024-01-25 ENCOUNTER — Other Ambulatory Visit: Payer: Self-pay | Admitting: Family Medicine

## 2024-01-25 DIAGNOSIS — Z1231 Encounter for screening mammogram for malignant neoplasm of breast: Secondary | ICD-10-CM

## 2024-01-26 ENCOUNTER — Ambulatory Visit

## 2024-01-28 ENCOUNTER — Ambulatory Visit

## 2024-01-28 DIAGNOSIS — M6281 Muscle weakness (generalized): Secondary | ICD-10-CM

## 2024-01-28 DIAGNOSIS — R293 Abnormal posture: Secondary | ICD-10-CM

## 2024-01-28 DIAGNOSIS — Z483 Aftercare following surgery for neoplasm: Secondary | ICD-10-CM

## 2024-01-28 DIAGNOSIS — D0511 Intraductal carcinoma in situ of right breast: Secondary | ICD-10-CM | POA: Diagnosis not present

## 2024-01-28 DIAGNOSIS — Z9189 Other specified personal risk factors, not elsewhere classified: Secondary | ICD-10-CM | POA: Diagnosis not present

## 2024-01-28 NOTE — Therapy (Signed)
 OUTPATIENT PHYSICAL THERAPY BREAST CANCER TREATMENT   Patient Name: Stephanie Roth MRN: [id number] DOB:[birth year], 68 y.o., female Today's Date: 01/28/2024  END OF SESSION:  PT End of Session - 01/28/24 1003     Visit Number 6    Number of Visits 10    Date for PT Re-Evaluation 02/07/24    Authorization Type 9 visits 01/03/24-01/31/24    Authorization - Visit Number 5    Authorization - Number of Visits 9    PT Start Time 1003    PT Stop Time 1052    PT Time Calculation (min) 49 min    Activity Tolerance Patient tolerated treatment well    Behavior During Therapy [facility] for tasks assessed/performed           Past Medical History:  Diagnosis Date   Cancer (HCC) 02/2023   right breast DCIS   DUB (dysfunctional uterine bleeding)    Fibroids    uterine   Goiter 06/16/2007   Hypertension    Iron deficiency anemia    Joint pain    Menopause    Past Surgical History:  Procedure Laterality Date   blepheroplasty Bilateral    BREAST BIOPSY Right 02/08/2023   MM RT BREAST BX W LOC DEV 1ST LESION IMAGE BX SPEC STEREO GUIDE 02/08/2023 [facility]   BREAST BIOPSY Right 02/08/2023   MM RT BREAST BX W LOC DEV EA AD LESION IMG BX SPEC STEREO GUIDE 02/08/2023 [facility]   BREAST RECONSTRUCTION WITH PLACEMENT OF TISSUE EXPANDER AND FLEX HD (ACELLULAR HYDRATED DERMIS) Right 04/19/2023   Procedure: IMMEDIATE RIGHT BREAST RECONSTRUCTION WITH PLACEMENT OF TISSUE EXPANDER AND FLEX HD (ACELLULAR HYDRATED DERMIS);  Surgeon: Lowery Estefana RAMAN, DO;  Location: [facility];  Service: Plastics;  Laterality: Right;   BREAST REDUCTION SURGERY Left 09/16/2023   Procedure: BREAST REDUCTION WITH LIPOSUCTION;  Surgeon: Lowery Estefana RAMAN, DO;  Location: [facility];  Service: Plastics;  Laterality: Left;   CESAREAN SECTION     COLONOSCOPY     ECTOPIC PREGNANCY SURGERY     GUM SURGERY     MASTECTOMY W/ SENTINEL NODE BIOPSY Right 04/19/2023   Procedure: RIGHT MASTECTOMY AND  SENTINEL NODE BIOPSY;  Surgeon: Curvin Deward MOULD, MD;  Location: [facility];  Service: General;  Laterality: Right;  PEC BLOCK   REMOVAL OF TISSUE EXPANDER AND PLACEMENT OF IMPLANT Right 09/16/2023   Procedure: REMOVAL, TISSUE EXPANDER, BREAST, WITH IMPLANT INSERTION;  Surgeon: Lowery Estefana RAMAN, DO;  Location: [facility];  Service: Plastics;  Laterality: Right;  right breast expander removal and placement of silicone implant, left breast reduction   TUBAL LIGATION     Patient Active Problem List   Diagnosis Date Noted   Breast cancer (HCC) 04/19/2023   Genetic testing 03/02/2023   Ductal carcinoma in situ (DCIS) of right breast 02/16/2023   History of hyperthyroidism 09/03/2010   Family history of diabetes mellitus 09/03/2010    REFERRING PROVIDER: Dr. Deward Curvin  REFERRING DIAG: Right breast cancer  THERAPY DIAG:  Abnormal posture  Ductal carcinoma in situ (DCIS) of right breast  Aftercare following surgery for neoplasm  Muscle weakness (generalized)  At risk for lymphedema  Rationale for Evaluation and Treatment: Rehabilitation  ONSET DATE: 09/16/2023  SUBJECTIVE:  SUBJECTIVE STATEMENT: Overall I am feeling much better. I feel better about the dead lift. The pain in my Lt upper arm from my shoulder is better. It really only bothers me now if I find myself sleeping on my stomach. I don't feel lit during the day anymore.   PERTINENT HISTORY:  Patient was diagnosed on 01/20/2023 with right high grade DCIS. It measures 9.5 cm. It is ER/PR negative. 9 nodes removed.    PATIENT GOALS:  Reassess how my recovery is going related to arm function, pain, and swelling.  PAIN:  Are you having pain? No  PRECAUTIONS: Recent Surgery, right UE Lymphedema risk, Other: recent breast  reconstruction  RED FLAGS: None   ACTIVITY LEVEL / LEISURE: not lifting a lot.  I am not lifting my groceries my son will usually do it.     OBJECTIVE:   PATIENT SURVEYS:  QUICK DASH: 22.73%  OBSERVATIONS: Incisions healed well.  Rt nipple missing with horizontal incision and Lt incision around the nipple.  Wearing sports bras as needed.    POSTURE:  WNL.    LYMPHEDEMA ASSESSMENT:   UPPER EXTREMITY AROM/PROM:   A/PROM RIGHT   eval   RIGHT 01/02/2024  Shoulder extension 37 55  Shoulder flexion 152 152  Shoulder abduction 155 164  Shoulder internal rotation 51 65  Shoulder external rotation 89 90                          (Blank rows = not tested)   A/PROM LEFT   eval LEFT 01/02/2024  Shoulder extension 40 50  Shoulder flexion 136 145  Shoulder abduction 127 160  Shoulder internal rotation 59 70  Shoulder external rotation 78 80 - pn top of shoulder                           (Blank rows = not tested)   CERVICAL AROM: All within normal limits   UPPER EXTREMITY STRENGTH: UPPER EXTREMITY MMT:  MMT Right eval Left eval  Shoulder flexion 4 4  Shoulder extension    Shoulder abduction 4 4  Shoulder adduction    Shoulder extension    Shoulder internal rotation 4 4  Shoulder external rotation 4 4  Middle trapezius    Lower trapezius    Elbow flexion    Elbow extension    Wrist flexion    Wrist extension    Wrist ulnar deviation    Wrist radial deviation    Wrist pronation    Wrist supination    Grip strength 20# 20#- average is 40# for age   (Blank rows = not tested)    LYMPHEDEMA ASSESSMENTS (in cm):    LANDMARK RIGHT   eval RIGHT 01/02/2024  10 cm proximal to olecranon process 30.4   Olecranon process 23.6   10 cm proximal to ulnar styloid process 21.6   Just proximal to ulnar styloid process 14.6   Across hand at thumb web space 18.2   At base of 2nd digit 6.5   (Blank rows = not tested)   LANDMARK LEFT   eval LEFT 01/02/2024  10 cm  proximal to olecranon process 30.5   Olecranon process 23.3   10 cm proximal to ulnar styloid process 20.8   Just proximal to ulnar styloid process 13.8   Across hand at thumb web space 17.7   At base of 2nd digit 6.3   (Blank rows =  not tested)  L-DEX LYMPHEDEMA SCREENING Measurement Type: Unilateral L-DEX MEASUREMENT EXTREMITY: Upper Extremity POSITION : Standing DOMINANT SIDE: Right At Risk Side: Right BASELINE SCORE (UNILATERAL): -1.6 L-DEX SCORE (UNILATERAL): 0.6 VALUE CHANGE (UNILAT): 2.2  TODAYS TREATMENT 01/28/24: Therapeutic Exercise Reviewed Strength ABC Program assessing technique throughout. Switched chest stretch to doorway pectoralis stretch. Continued with 3# as pt reports this still challenging for her at home.  She did require review of quadruped alt UE/LE to decrease lumbar lordosis and focus on keeping core engaged. Able to return improved demo after cuing. Also reviewed reverse curl in lieu of crunches as pt reports she was just doing pelvic tilt and had forgotten to bring knees over hips and liked the increase challenge of this better. Min VC's and demo for dead lift as pt was bending knees so correct this.   01/19/24: Therapeutic Exercise Strength ABC Program having her return demo of each and Adjusted technique if needed.Used 3# dumbbells for each weighted exercise. Pt performed very well but was not doing the dead lifts correctly and felt more comfortable with practice.    01/14/24: Self Care  Explained Strength ABC Program to pt, how this can further benefit this stage of her recovery, and educated her on the low and slow process of progressing weights and resistance before beginning Strength ABC Program this morning.  Therapeutic Activities Educated pt in Strength ABC Program having her return demo of each and demo prn. Adjusted technique during and pt was able to return good demo of each by end of session. Used 3# dumbbells as pt reports weights were easy at  last session.   01/12/24 Pulleys into flexion x (easy overall)  Ball roll into flexion x 5 with 5 holds - feeling left lat stretch Back against wall flexion 2# x 10 Bicep curls 2# x 10 Yellow band standing  rows x 10    Extension bil x 10    Bil ER x 10 Supine Yellow band  Horizontal abduction x 10     Diagonals x 10 bil      PATIENT EDUCATION:  Education details: Artist Person educated: Patient Education method: Explanation, demonstration, tactile and VC's and handout issued with flowsheet Education comprehension: verbalized understanding, returned demo, cuing for correct technique and pt will benefit from further review  HOME EXERCISE PROGRAM: Reviewed previously given post op HEP. Access Code: [email or web address] URL: [email or web address] Date: 01/12/2024 Prepared by: Saddie Raw  Exercises - Standing Shoulder Flexion to 90 Degrees with Dumbbells  - 1 x daily - 2-3 x weekly - 1 sets - 10 reps - no hold - Standing Bicep Curls Supinated with Dumbbells  - 1 x daily - 2-3 x weekly - 1 sets - 10 reps - no hold - Standing Row with Anchored Resistance  - 1 x daily - 2-3 x weekly - 1 sets - 10 reps - no hold - Shoulder Extension with Resistance  - 1 x daily - 2-3 x weekly - 1 sets - 10 reps - no hold - Standing Shoulder External Rotation with Resistance  - 1 x daily - 2-3 x weekly - 1 sets - 10 reps - no hold - Supine Shoulder Horizontal Abduction with Resistance  - 1 x daily - 2-3 x weekly - 1 sets - 10 reps - no hold  01/14/24: Strength ABC Program  ASSESSMENT:  CLINICAL IMPRESSION: It was beneficial to review Strength ABC program again today, see above corrections. Filled out pts exercise log today with reps  and weights. Instructed also that she could do a few of the exs together in circuit style which she liked doing for some of them. Pt has done excellent this episode of care and is ready for D/C at this time.   Pt will benefit from skilled therapeutic  intervention to improve on the following deficits: Decreased knowledge of precautions, impaired UE functional use, pain, decreased ROM, postural dysfunction.   PT treatment/interventions: ADL/Self care home management, 97110-Therapeutic exercises, 97530- Therapeutic activity, [id number]- Neuromuscular re-education, 97535- Self Care, and 02859- Manual therapy   GOALS: Goals reviewed with patient? Yes  LONG TERM GOALS:  (STG=LTG)  GOALS Name Target Date  Goal status  1 Pt will demonstrate she has regained full shoulder ROM and function post operatively compared to baselines.  Baseline: 01/03/24 MET  2 Pt will be educated on lymphedema risk and risk reduction 02/07/24 MET  3 Pt will report ability to carry in shopping bags from going to the grocery store 02/07/24 MET  4 Pt will be ind with final HEP 02/07/24 MET     PLAN:  PT FREQUENCY/DURATION: 1-2x per week x 4 weeks    PLAN FOR NEXT SESSION: D/C this visit.    [facility]  [address]  [place] [address]  [phone]    Aden Berwyn Caldron, PTA 01/28/2024, 10:53 AM   PHYSICAL THERAPY DISCHARGE SUMMARY  Visits from Start of Care: 6  Current functional level related to goals / functional outcomes: All goals met   Remaining deficits: Lymphedma risk   Education / Equipment: Final self care plan   Plan: Patient agrees to discharge.  Patient is being discharged due to meeting the stated rehab goals.     Saddie Raw, PT

## 2024-02-02 ENCOUNTER — Encounter: Admitting: Rehabilitation

## 2024-02-04 ENCOUNTER — Encounter

## 2024-02-08 ENCOUNTER — Ambulatory Visit
Admission: RE | Admit: 2024-02-08 | Discharge: 2024-02-08 | Disposition: A | Discharge status: Home / Self Care | Source: Ambulatory Visit | Attending: Family Medicine | Admitting: Family Medicine

## 2024-02-08 ENCOUNTER — Other Ambulatory Visit: Payer: Self-pay | Admitting: Family Medicine

## 2024-02-08 DIAGNOSIS — Z1231 Encounter for screening mammogram for malignant neoplasm of breast: Secondary | ICD-10-CM

## 2024-02-10 DIAGNOSIS — I10 Essential (primary) hypertension: Secondary | ICD-10-CM | POA: Diagnosis not present

## 2024-02-10 DIAGNOSIS — L508 Other urticaria: Secondary | ICD-10-CM | POA: Diagnosis not present

## 2024-02-10 DIAGNOSIS — G4733 Obstructive sleep apnea (adult) (pediatric): Secondary | ICD-10-CM | POA: Diagnosis not present

## 2024-02-10 DIAGNOSIS — E7800 Pure hypercholesterolemia, unspecified: Secondary | ICD-10-CM | POA: Diagnosis not present

## 2024-02-10 DIAGNOSIS — Z87898 Personal history of other specified conditions: Secondary | ICD-10-CM | POA: Diagnosis not present

## 2024-02-10 DIAGNOSIS — D0511 Intraductal carcinoma in situ of right breast: Secondary | ICD-10-CM | POA: Diagnosis not present

## 2024-02-28 ENCOUNTER — Encounter: Payer: Self-pay | Admitting: Adult Health

## 2024-02-28 ENCOUNTER — Inpatient Hospital Stay: Payer: Medicare Other | Attending: Adult Health | Admitting: Adult Health

## 2024-02-28 VITALS — BP 108/60 | HR 65 | Temp 98.0°F | Resp 16 | Ht 59.0 in | Wt 156.6 lb

## 2024-02-28 DIAGNOSIS — D0511 Intraductal carcinoma in situ of right breast: Secondary | ICD-10-CM | POA: Diagnosis not present

## 2024-02-28 DIAGNOSIS — Z171 Estrogen receptor negative status [ER-]: Secondary | ICD-10-CM | POA: Insufficient documentation

## 2024-02-28 DIAGNOSIS — Z9011 Acquired absence of right breast and nipple: Secondary | ICD-10-CM | POA: Diagnosis not present

## 2024-02-28 NOTE — Progress Notes (Signed)
 [facility] Cancer Follow up:    Stephanie Lenis, MD [address] [place] [address]   DIAGNOSIS:  Cancer Staging  Ductal carcinoma in situ (DCIS) of right breast Staging form: Breast, AJCC 8th Edition - Clinical stage from 02/17/2023: Stage 0 (cTis (DCIS), cN0, cM0, PR-, HER2: Not Assessed) - Signed by Stephanie Potts, MD on 02/18/2023 Stage prefix: Initial diagnosis Nuclear grade: G3 Laterality: Right Staged by: Pathologist and managing physician Stage used in treatment planning: Yes National guidelines used in treatment planning: Yes Type of national guideline used in treatment planning: NCCN    SUMMARY OF ONCOLOGIC HISTORY: Oncology History  Ductal carcinoma in situ (DCIS) of right breast  02/08/2023 Initial Diagnosis   Screening detected right breast calcifications spanning 9.5 cm.  2 biopsies performed anterior and posterior reveal high-grade DCIS with foci suspicious for microinvasion ER 0%, PR 0%, axilla negative   02/17/2023 Cancer Staging   Staging form: Breast, AJCC 8th Edition - Clinical stage from 02/17/2023: Stage 0 (cTis (DCIS), cN0, cM0, PR-, HER2: Not Assessed) - Signed by Stephanie Potts, MD on 02/18/2023 Stage prefix: Initial diagnosis Nuclear grade: G3 Laterality: Right Staged by: Pathologist and managing physician Stage used in treatment planning: Yes National guidelines used in treatment planning: Yes Type of national guideline used in treatment planning: NCCN   02/27/2023 Genetic Testing   Negative [facility] CancerNext-Expanded +RNAinsight Panel.  Report date is 02/27/2023.   The CancerNext-Expanded gene panel offered by [facility] and includes sequencing, rearrangement, and RNA analysis for the following 71 genes:  AIP, ALK, APC, ATM, BAP1, BARD1, BMPR1A, BRCA1, BRCA2, BRIP1, CDC73, CDH1, CDK4, CDKN1B, CDKN2A, CHEK2, DICER1, FH, FLCN, KIF1B, LZTR1,MAX, MEN1, MET, MLH1, MSH2, MSH6, MUTYH, NF1, NF2, NTHL1, PALB2, PHOX2B, PMS2, POT1,  PRKAR1A, PTCH1, PTEN, RAD51C,RAD51D, RB1, RET, SDHA, SDHAF2, SDHB, SDHC, SDHD, SMAD4, SMARCA4, SMARCB1, SMARCE1, STK11, SUFU, TMEM127, TP53,TSC1, TSC2 and VHL (sequencing and deletion/duplication); AXIN2, CTNNA1, EGFR, EGLN1, HOXB13, KIT, MITF, MSH3, PDGFRA, POLD1 and POLE (sequencing only); EPCAM and GREM1 (deletion/duplication only).    04/19/2023 Surgery   Right mastectomy: DCIS grade 3 with comedonecrosis, margins negative, 0/9 lymph nodes negative, ER 0%, PR 0%     CURRENT THERAPY: observation  INTERVAL HISTORY:  Discussed the use of AI scribe software for clinical note transcription with the patient, who gave verbal consent to proceed.  History of Present Illness Stephanie Roth is a 68 year old female with right breast DCIS ERPR negative, status post mastectomy, who presents for a follow-up visit.Stephanie Roth cotninues on observation alone.   Stephanie Roth was diagnosed with right breast ductal carcinoma in situ (DCIS) that was estrogen receptor/progesterone receptor (ERPR) negative in August 2024 and underwent a mastectomy. Stephanie Roth continues with annual left breast screening mammograms, with the most recent on February 08, 2024, showing no evidence of malignancy and breast density category B.  Stephanie Roth has no issues with breast reconstruction and no new breast changes or concerns. Stephanie Roth uses an extra pad for symmetry but has no other complaints. Stephanie Roth experiences no cough, shortness of breath, or chest pain.  Stephanie Roth is exercising regularly and eating fruits and vegetables.       Patient Active Problem List   Diagnosis Date Noted   Breast cancer (HCC) 04/19/2023   Genetic testing 03/02/2023   Ductal carcinoma in situ (DCIS) of right breast 02/16/2023   History of hyperthyroidism 09/03/2010   Family history of diabetes mellitus 09/03/2010    is allergic to monosodium glutamate.  MEDICAL HISTORY: Past Medical  History:  Diagnosis Date   Cancer (HCC) 02/2023   right breast DCIS   DUB (dysfunctional uterine  bleeding)    Fibroids    uterine   Goiter 06/16/2007   Hypertension    Iron deficiency anemia    Joint pain    Menopause     SURGICAL HISTORY: Past Surgical History:  Procedure Laterality Date   blepheroplasty Bilateral    BREAST BIOPSY Right 02/08/2023   MM RT BREAST BX W LOC DEV 1ST LESION IMAGE BX SPEC STEREO GUIDE 02/08/2023 [facility]   BREAST BIOPSY Right 02/08/2023   MM RT BREAST BX W LOC DEV EA AD LESION IMG BX SPEC STEREO GUIDE 02/08/2023 [facility]   BREAST RECONSTRUCTION WITH PLACEMENT OF TISSUE EXPANDER AND FLEX HD (ACELLULAR HYDRATED DERMIS) Right 04/19/2023   Procedure: IMMEDIATE RIGHT BREAST RECONSTRUCTION WITH PLACEMENT OF TISSUE EXPANDER AND FLEX HD (ACELLULAR HYDRATED DERMIS);  Surgeon: Stephanie Estefana RAMAN, DO;  Location: [facility];  Service: Plastics;  Laterality: Right;   BREAST REDUCTION SURGERY Left 09/16/2023   Procedure: BREAST REDUCTION WITH LIPOSUCTION;  Surgeon: Stephanie Estefana RAMAN, DO;  Location: [facility];  Service: Plastics;  Laterality: Left;   CESAREAN SECTION     COLONOSCOPY     ECTOPIC PREGNANCY SURGERY     GUM SURGERY     MASTECTOMY W/ SENTINEL NODE BIOPSY Right 04/19/2023   Procedure: RIGHT MASTECTOMY AND SENTINEL NODE BIOPSY;  Surgeon: Curvin Deward MOULD, MD;  Location: [facility];  Service: General;  Laterality: Right;  PEC BLOCK   REMOVAL OF TISSUE EXPANDER AND PLACEMENT OF IMPLANT Right 09/16/2023   Procedure: REMOVAL, TISSUE EXPANDER, BREAST, WITH IMPLANT INSERTION;  Surgeon: Stephanie Estefana RAMAN, DO;  Location: [facility];  Service: Plastics;  Laterality: Right;  right breast expander removal and placement of silicone implant, left breast reduction   TUBAL LIGATION      SOCIAL HISTORY: Social History   Socioeconomic History   Marital status: Divorced    Spouse name: Not on file   Number of children: Not on file   Years of education: Not on file   Highest education level: Not on file  Occupational History    Not on file  Tobacco Use   Smoking status: Never   Smokeless tobacco: Never  Vaping Use   Vaping status: Never Used  Substance and Sexual Activity   Alcohol use: Never   Drug use: Never   Sexual activity: Not Currently    Birth control/protection: Post-menopausal, Surgical    Comment: BTL  Other Topics Concern   Not on file  Social History Narrative   Not on file   Social Drivers of Health   Financial Resource Strain: Not on file  Food Insecurity: No Food Insecurity (04/20/2023)   Hunger Vital Sign    Worried About Running Out of Food in the Last Year: Never true    Ran Out of Food in the Last Year: Never true  Transportation Needs: No Transportation Needs (04/20/2023)   PRAPARE - Administrator, Civil Service (Medical): No    Lack of Transportation (Non-Medical): No  Physical Activity: Not on file  Stress: Not on file  Social Connections: Not on file  Intimate Partner Violence: Not At Risk (04/20/2023)   Humiliation, Afraid, Rape, and Kick questionnaire    Fear of Current or Ex-Partner: No    Emotionally Abused: No    Physically Abused: No    Sexually Abused: No    FAMILY HISTORY:  Family History  Problem Relation Age of Onset   Hypertension Mother    COPD Mother    Congestive Heart Failure Mother    Arthritis Mother    Hypertension Father    Aortic aneurysm Father    Myelodysplastic syndrome Father 61   Psoriasis Sister    Eczema Sister    Arthritis Sister    Cancer Maternal Grandfather    Prostate cancer Maternal Grandfather        mets   Cancer Maternal Uncle 67       unknown type   Cancer Cousin    Colon cancer Cousin 35       paternal female cousin   Breast cancer Cousin 68       paternal female cousin   Cancer Cousin 55       unk type; pat female cousin   Cervical cancer Cousin 50       mat cousin   Cancer Cousin        x5 pat cousins; unknown type; 4 females--one dx 37; one female dx unknown age    Review of Systems  Constitutional:   Negative for appetite change, chills, fatigue, fever and unexpected weight change.  HENT:   Negative for hearing loss, lump/mass and trouble swallowing.   Eyes:  Negative for eye problems and icterus.  Respiratory:  Negative for chest tightness, cough and shortness of breath.   Cardiovascular:  Negative for chest pain, leg swelling and palpitations.  Gastrointestinal:  Negative for abdominal distention, abdominal pain, constipation, diarrhea, nausea and vomiting.  Endocrine: Negative for hot flashes.  Genitourinary:  Negative for difficulty urinating.   Musculoskeletal:  Negative for arthralgias.  Skin:  Negative for itching and rash.  Neurological:  Negative for dizziness, extremity weakness, headaches and numbness.  Hematological:  Negative for adenopathy. Does not bruise/bleed easily.  Psychiatric/Behavioral:  Negative for depression. The patient is not nervous/anxious.       PHYSICAL EXAMINATION   Onc Performance Status - 02/28/24 1100       ECOG Perf Status   ECOG Perf Status Fully active, able to carry on all pre-disease performance without restriction      KPS SCALE   KPS % SCORE Normal, no compliants, no evidence of disease          Vitals:   02/28/24 1115  BP: 108/60  Pulse: 65  Resp: 16  Temp: 98 F (36.7 C)  SpO2: 100%    Physical Exam Constitutional:      General: Stephanie Roth is not in acute distress.    Appearance: Normal appearance. Stephanie Roth is not toxic-appearing.  HENT:     Head: Normocephalic and atraumatic.     Mouth/Throat:     Mouth: Mucous membranes are moist.     Pharynx: Oropharynx is clear. No oropharyngeal exudate or posterior oropharyngeal erythema.  Eyes:     General: No scleral icterus. Cardiovascular:     Rate and Rhythm: Normal rate and regular rhythm.     Pulses: Normal pulses.     Heart sounds: Normal heart sounds.  Pulmonary:     Effort: Pulmonary effort is normal.     Breath sounds: Normal breath sounds.  Chest:     Comments: Right  breast s/p mastectomy and reconstruction, no sign of local recurrence, left breast benign Abdominal:     General: Abdomen is flat. Bowel sounds are normal. There is no distension.     Palpations: Abdomen is soft.     Tenderness: There is no  abdominal tenderness.  Musculoskeletal:        General: No swelling.     Cervical back: Neck supple.  Lymphadenopathy:     Cervical: No cervical adenopathy.     Upper Body:     Right upper body: No supraclavicular or axillary adenopathy.     Left upper body: No supraclavicular or axillary adenopathy.  Skin:    General: Skin is warm and dry.     Findings: No rash.  Neurological:     General: No focal deficit present.     Mental Status: Stephanie Roth is alert.  Psychiatric:        Mood and Affect: Mood normal.        Behavior: Behavior normal.     LABORATORY DATA:  CBC    Component Value Date/Time   WBC 7.0 04/15/2023 1016   RBC 4.41 04/15/2023 1016   HGB 13.0 04/15/2023 1016   HGB 13.8 02/17/2023 1250   HGB 13.6 03/03/2018 1144   HCT 40.0 04/15/2023 1016   HCT 41.9 03/03/2018 1144   PLT 416 (H) 04/15/2023 1016   PLT 326 02/17/2023 1250   MCV 90.7 04/15/2023 1016   MCV 92 03/03/2018 1144   MCH 29.5 04/15/2023 1016   MCHC 32.5 04/15/2023 1016   RDW 15.3 04/15/2023 1016   RDW 13.9 03/03/2018 1144   LYMPHSABS 2.0 02/17/2023 1250   LYMPHSABS 1.7 03/03/2018 1144   MONOABS 0.4 02/17/2023 1250   EOSABS 0.1 02/17/2023 1250   EOSABS 0.1 03/03/2018 1144   BASOSABS 0.1 02/17/2023 1250   BASOSABS 0.1 03/03/2018 1144    CMP     Component Value Date/Time   NA 139 04/15/2023 1016   NA 143 03/03/2018 1144   K 3.6 04/15/2023 1016   CL 108 04/15/2023 1016   CO2 24 04/15/2023 1016   GLUCOSE 100 (H) 04/15/2023 1016   BUN 28 (H) 04/15/2023 1016   BUN 21 03/03/2018 1144   CREATININE 0.89 04/15/2023 1016   CREATININE 0.82 02/17/2023 1250   CALCIUM 9.4 04/15/2023 1016   PROT 7.9 02/17/2023 1250   PROT 7.6 03/03/2018 1144   ALBUMIN 4.1 02/17/2023  1250   ALBUMIN 4.4 03/03/2018 1144   AST 16 02/17/2023 1250   ALT 16 02/17/2023 1250   ALKPHOS 107 02/17/2023 1250   BILITOT 0.3 02/17/2023 1250   GFRNONAA >60 04/15/2023 1016   GFRNONAA >60 02/17/2023 1250   GFRAA 80 03/03/2018 1144     ASSESSMENT and THERAPY PLAN:   Ductal carcinoma in situ (DCIS) of right breast 04/19/2023:Right mastectomy: DCIS grade 3 with comedonecrosis, margins negative, 0/9 lymph nodes negative, ER 0%, PR 0%  Assessment and Plan Assessment & Plan Right breast ductal carcinoma in situ (DCIS), ER/PR negative Status post mastectomy with no recurrence. Recent left breast mammogram negative for malignancy. - Continue annual left breast screening mammograms. - Maintain healthy lifestyle with regular exercise and balanced diet. - Stay up to date with primary care and preventive care visits. - Contact clinic if any breast changes or concerns arise before next visit. - RTC in 1 year for continued long term f/u.        All questions were answered. The patient knows to call the clinic with any problems, questions or concerns. We can certainly see the patient much sooner if necessary.  Total encounter time:20 minutes*in face-to-face visit time, chart review, lab review, care coordination, order entry, and documentation of the encounter time.    Morna Kendall, NP 02/28/24 1:04 PM [facility] [address] [place], [place] [address] Tel. [phone]    Fax. [phone]  *Total Encounter Time as defined by the Centers for Medicare and Medicaid Services includes, in addition to the face-to-face time of a patient visit (documented in the note above) non-face-to-face time: obtaining and reviewing outside history, ordering and reviewing medications, tests or procedures, care coordination (communications with other health care professionals or caregivers) and documentation in the medical record.

## 2024-02-28 NOTE — Assessment & Plan Note (Signed)
 04/19/2023:Right mastectomy: DCIS grade 3 with comedonecrosis, margins negative, 0/9 lymph nodes negative, ER 0%, PR 0%  Assessment and Plan Assessment & Plan Right breast ductal carcinoma in situ (DCIS), ER/PR negative Status post mastectomy with no recurrence. Recent left breast mammogram negative for malignancy. - Continue annual left breast screening mammograms. - Maintain healthy lifestyle with regular exercise and balanced diet. - Stay up to date with primary care and preventive care visits. - Contact clinic if any breast changes or concerns arise before next visit. - RTC in 1 year for continued long term f/u.

## 2024-04-10 ENCOUNTER — Ambulatory Visit: Payer: Self-pay | Attending: General Surgery

## 2024-04-10 DIAGNOSIS — M6281 Muscle weakness (generalized): Secondary | ICD-10-CM | POA: Insufficient documentation

## 2024-04-10 DIAGNOSIS — Z483 Aftercare following surgery for neoplasm: Secondary | ICD-10-CM | POA: Insufficient documentation

## 2024-04-10 DIAGNOSIS — D0511 Intraductal carcinoma in situ of right breast: Secondary | ICD-10-CM | POA: Insufficient documentation

## 2024-04-10 DIAGNOSIS — Z9189 Other specified personal risk factors, not elsewhere classified: Secondary | ICD-10-CM | POA: Insufficient documentation

## 2024-04-10 DIAGNOSIS — R293 Abnormal posture: Secondary | ICD-10-CM | POA: Insufficient documentation

## 2024-04-25 ENCOUNTER — Encounter: Payer: Self-pay | Admitting: Plastic Surgery

## 2024-04-25 ENCOUNTER — Ambulatory Visit: Admitting: Plastic Surgery

## 2024-04-25 VITALS — BP 153/89 | HR 72

## 2024-04-25 DIAGNOSIS — Z853 Personal history of malignant neoplasm of breast: Secondary | ICD-10-CM

## 2024-04-25 DIAGNOSIS — N651 Disproportion of reconstructed breast: Secondary | ICD-10-CM

## 2024-04-25 DIAGNOSIS — C50411 Malignant neoplasm of upper-outer quadrant of right female breast: Secondary | ICD-10-CM

## 2024-04-25 DIAGNOSIS — Z1379 Encounter for other screening for genetic and chromosomal anomalies: Secondary | ICD-10-CM

## 2024-04-25 NOTE — Progress Notes (Signed)
   Subjective:    Patient ID: Stephanie Roth, female    DOB: 03/17/1956, 68 y.o.   MRN: [id number]  The patient is a 68 year old female here for follow-up on her breast reconstruction.  She had right breast reconstruction with a Mentor smooth round ultra high-profile 650 cc implant and a left breast reduction.  She has a little bit of asymmetry.  She may want that fix but she does not want to do anything right now.  She is interested in nipple areola tattoo for the right breast.      Review of Systems  Constitutional: Negative.   Eyes: Negative.   Respiratory: Negative.    Cardiovascular: Negative.   Gastrointestinal: Negative.   Genitourinary: Negative.   Musculoskeletal: Negative.        Objective:   Physical Exam       Assessment & Plan:     ICD-10-CM   1. Malignant neoplasm of upper-outer quadrant of right female breast, unspecified estrogen receptor status (HCC)  C50.411     2. Genetic testing  Z13.79        We will refer the patient to [facility] for nipple areolar tattooing.  Will plan to see the patient in 1 year.  Pictures were obtained of the patient and placed in the chart with the patient's or guardian's permission.

## 2024-04-27 ENCOUNTER — Ambulatory Visit: Admitting: Physician Assistant

## 2024-04-27 VITALS — BP 154/78 | HR 73

## 2024-04-27 DIAGNOSIS — Z9011 Acquired absence of right breast and nipple: Secondary | ICD-10-CM | POA: Diagnosis not present

## 2024-04-27 DIAGNOSIS — Z9889 Other specified postprocedural states: Secondary | ICD-10-CM

## 2024-04-27 DIAGNOSIS — C50411 Malignant neoplasm of upper-outer quadrant of right female breast: Secondary | ICD-10-CM

## 2024-04-27 DIAGNOSIS — Z853 Personal history of malignant neoplasm of breast: Secondary | ICD-10-CM

## 2024-04-27 NOTE — Progress Notes (Signed)
 Referring Provider [name], MD [phone] [place],  [place] [address]   CC:  Chief Complaint  Patient presents with   consult      Stephanie Roth is an 68 y.o. female.  HPI: Patient is a very pleasant 68 year old female with history of right breast DCIS s/p unilateral mastectomy with implant-based reconstruction and contralateral reduction for symmetry who presents to clinic to discuss nipple areolar tattoo restoration.  Today, patient states that she remains interested in areola restoration.  She inquired about expectations.  Discussed with patient that this typically involves several sessions.  Discussed the risks of the procedure which are mainly bleeding, intraoperative discomfort, or patient dissatisfaction with the results.  The risk of implant rupture is exceedingly low.  Patient denies any history of radiation.  She does not take blood thinners or immunosuppressants.  She also denies any history of diabetes or use of nicotine-containing products.  Allergies  Allergen Reactions   Monosodium Glutamate Swelling    Facial swelling    Outpatient Encounter Medications as of 04/27/2024  Medication Sig   amLODipine (NORVASC) 2.5 MG tablet Take 2.5 mg by mouth daily.   Apoaequorin (PREVAGEN PO) Take 1 tablet by mouth every 14 (fourteen) days.   BIOTIN PO Take 1 tablet by mouth daily. Hair   Cholecalciferol (VITAMIN D3) 2000 units TABS Take 2,000 Units by mouth daily.   Cyanocobalamin (VITAMIN B12) 1000 MCG TBCR Take 2,000 mcg by mouth daily.   fexofenadine (ALLEGRA) 60 MG tablet Take 60 mg by mouth daily.   Flaxseed, Linseed, (FLAXSEED OIL PO) Take 1,300 mg by mouth 2 (two) times daily.   loratadine (CLARITIN) 10 MG tablet Take 10 mg by mouth daily.   Multiple Vitamins-Minerals (WOMENS MULTIVITAMIN PO) Take 1 tablet by mouth daily.   niacin (VITAMIN B3) 500 MG ER tablet Take 500 mg by mouth daily as needed (cholesterol).   ondansetron  (ZOFRAN -ODT) 4 MG  disintegrating tablet Take 1 tablet (4 mg total) by mouth every 8 (eight) hours as needed for nausea or vomiting.   polyethylene glycol powder (GLYCOLAX /MIRALAX ) 17 GM/SCOOP powder Take 1 Container by mouth once.   No facility-administered encounter medications on file as of 04/27/2024.     Past Medical History:  Diagnosis Date   Cancer (HCC) 02/2023   right breast DCIS   DUB (dysfunctional uterine bleeding)    Fibroids    uterine   Goiter 06/16/2007   Hypertension    Iron deficiency anemia    Joint pain    Menopause     Past Surgical History:  Procedure Laterality Date   blepheroplasty Bilateral    BREAST BIOPSY Right 02/08/2023   MM RT BREAST BX W LOC DEV 1ST LESION IMAGE BX SPEC STEREO GUIDE 02/08/2023 [facility]   BREAST BIOPSY Right 02/08/2023   MM RT BREAST BX W LOC DEV EA AD LESION IMG BX SPEC STEREO GUIDE 02/08/2023 [facility]   BREAST RECONSTRUCTION WITH PLACEMENT OF TISSUE EXPANDER AND FLEX HD (ACELLULAR HYDRATED DERMIS) Right 04/19/2023   Procedure: IMMEDIATE RIGHT BREAST RECONSTRUCTION WITH PLACEMENT OF TISSUE EXPANDER AND FLEX HD (ACELLULAR HYDRATED DERMIS);  Surgeon: Lowery Estefana RAMAN, DO;  Location: [facility];  Service: Plastics;  Laterality: Right;   BREAST REDUCTION SURGERY Left 09/16/2023   Procedure: BREAST REDUCTION WITH LIPOSUCTION;  Surgeon: Lowery Estefana RAMAN, DO;  Location: [facility];  Service: Plastics;  Laterality: Left;   CESAREAN SECTION     COLONOSCOPY     ECTOPIC  PREGNANCY SURGERY     GUM SURGERY     MASTECTOMY W/ SENTINEL NODE BIOPSY Right 04/19/2023   Procedure: RIGHT MASTECTOMY AND SENTINEL NODE BIOPSY;  Surgeon: Curvin Deward MOULD, MD;  Location: [facility];  Service: General;  Laterality: Right;  PEC BLOCK   REMOVAL OF TISSUE EXPANDER AND PLACEMENT OF IMPLANT Right 09/16/2023   Procedure: REMOVAL, TISSUE EXPANDER, BREAST, WITH IMPLANT INSERTION;  Surgeon: Lowery Estefana RAMAN, DO;  Location: [facility];   Service: Plastics;  Laterality: Right;  right breast expander removal and placement of silicone implant, left breast reduction   TUBAL LIGATION      Family History  Problem Relation Age of Onset   Hypertension Mother    COPD Mother    Congestive Heart Failure Mother    Arthritis Mother    Hypertension Father    Aortic aneurysm Father    Myelodysplastic syndrome Father 34   Psoriasis Sister    Eczema Sister    Arthritis Sister    Cancer Maternal Grandfather    Prostate cancer Maternal Grandfather        mets   Cancer Maternal Uncle 31       unknown type   Cancer Cousin    Colon cancer Cousin 65       paternal female cousin   Breast cancer Cousin 53       paternal female cousin   Cancer Cousin 61       unk type; pat female cousin   Cervical cancer Cousin 50       mat cousin   Cancer Cousin        x5 pat cousins; unknown type; 4 females--one dx 39; one female dx unknown age    Social History   Social History Narrative   Not on file     Review of Systems General: Denies fevers or chills Cardio: Denies chest pain Pulmonary: Denies difficulty breathing  Physical Exam    04/27/2024    3:24 PM 04/25/2024    1:12 PM 02/28/2024   11:15 AM  Vitals with BMI  Height   4' 11  Weight   156 lbs 10 oz  BMI   31.61  Systolic 154 153 891  Diastolic 78 89 60  Pulse 73 72 65    General:  No acute distress, nontoxic appearing  Respiratory: No increased work of breathing Neuro: Alert and oriented Psychiatric: Normal mood and affect   Assessment/Plan  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  She is understanding of the risks and benefits.  Written consent forms are obtained and placed in chart.  Plan for follow-up in 4 to 6 weeks for initial tattoo session.  This will take approximately 2 hours duration to ensure appropriate size, location, and color selection.  Honora Seip PA-C 04/27/2024,  4:18 PM

## 2024-07-10 ENCOUNTER — Ambulatory Visit

## 2025-03-01 ENCOUNTER — Ambulatory Visit: Admitting: Adult Health

## 2025-04-24 ENCOUNTER — Ambulatory Visit: Admitting: Plastic Surgery
